# Patient Record
Sex: Female | Born: 1945
Health system: Southern US, Community
[De-identification: ages and names within clinical notes are randomized; demographics above are authoritative.]

## PROBLEM LIST (undated history)

## (undated) DIAGNOSIS — F419 Anxiety disorder, unspecified: Secondary | ICD-10-CM

## (undated) DIAGNOSIS — Z87442 Personal history of urinary calculi: Secondary | ICD-10-CM

## (undated) DIAGNOSIS — C801 Malignant (primary) neoplasm, unspecified: Secondary | ICD-10-CM

## (undated) HISTORY — PX: BREAST BIOPSY: SHX20

---

## 1988-03-08 DIAGNOSIS — C4491 Basal cell carcinoma of skin, unspecified: Secondary | ICD-10-CM

## 1988-03-08 HISTORY — DX: Basal cell carcinoma of skin, unspecified: C44.91

## 1998-05-31 ENCOUNTER — Other Ambulatory Visit: Admission: RE | Admit: 1998-05-31 | Discharge: 1998-05-31 | Payer: Self-pay | Admitting: Obstetrics and Gynecology

## 1998-07-19 ENCOUNTER — Other Ambulatory Visit: Admission: RE | Admit: 1998-07-19 | Discharge: 1998-07-19 | Payer: Self-pay | Admitting: Obstetrics and Gynecology

## 1999-04-27 DIAGNOSIS — C801 Malignant (primary) neoplasm, unspecified: Secondary | ICD-10-CM

## 1999-04-27 HISTORY — DX: Malignant (primary) neoplasm, unspecified: C80.1

## 1999-05-28 HISTORY — PX: ABDOMINAL HYSTERECTOMY: SHX81

## 2002-03-29 ENCOUNTER — Encounter: Payer: Self-pay | Admitting: Obstetrics and Gynecology

## 2002-03-29 ENCOUNTER — Ambulatory Visit (HOSPITAL_COMMUNITY): Admission: RE | Admit: 2002-03-29 | Discharge: 2002-03-29 | Payer: Self-pay | Admitting: Obstetrics and Gynecology

## 2003-06-15 ENCOUNTER — Ambulatory Visit (HOSPITAL_COMMUNITY): Admission: RE | Admit: 2003-06-15 | Discharge: 2003-06-15 | Payer: Self-pay | Admitting: Internal Medicine

## 2005-01-11 ENCOUNTER — Ambulatory Visit (HOSPITAL_COMMUNITY): Admission: RE | Admit: 2005-01-11 | Discharge: 2005-01-11 | Payer: Self-pay | Admitting: Internal Medicine

## 2005-05-27 HISTORY — PX: CATARACT EXTRACTION W/ INTRAOCULAR LENS  IMPLANT, BILATERAL: SHX1307

## 2005-07-23 ENCOUNTER — Ambulatory Visit (HOSPITAL_COMMUNITY): Admission: RE | Admit: 2005-07-23 | Discharge: 2005-07-23 | Payer: Self-pay | Admitting: Ophthalmology

## 2005-08-06 ENCOUNTER — Ambulatory Visit (HOSPITAL_COMMUNITY): Admission: RE | Admit: 2005-08-06 | Discharge: 2005-08-06 | Payer: Self-pay | Admitting: Ophthalmology

## 2006-03-12 DIAGNOSIS — D099 Carcinoma in situ, unspecified: Secondary | ICD-10-CM

## 2006-03-12 HISTORY — DX: Carcinoma in situ, unspecified: D09.9

## 2006-09-13 IMAGING — CR DG HIP W/ PELVIS BILAT
5 series · 5 of 5 positions shown · non-contrast
Comparison: None.

CLINICAL DATA: Bilateral hip pain for six months.  No known injury. 
 BILATERAL HIP ? 2 VIEW AND AP PELVIS ? 1 VIEW:

[view not recorded (1 of 5)]
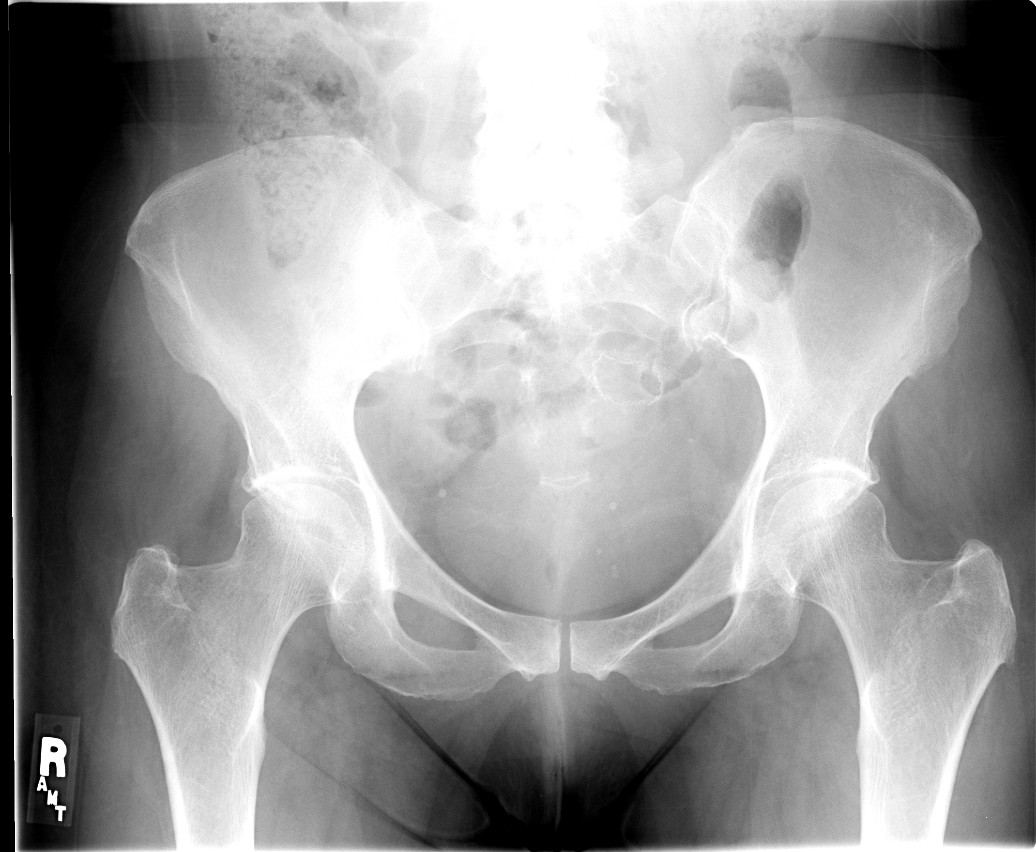

[view not recorded (2 of 5)]
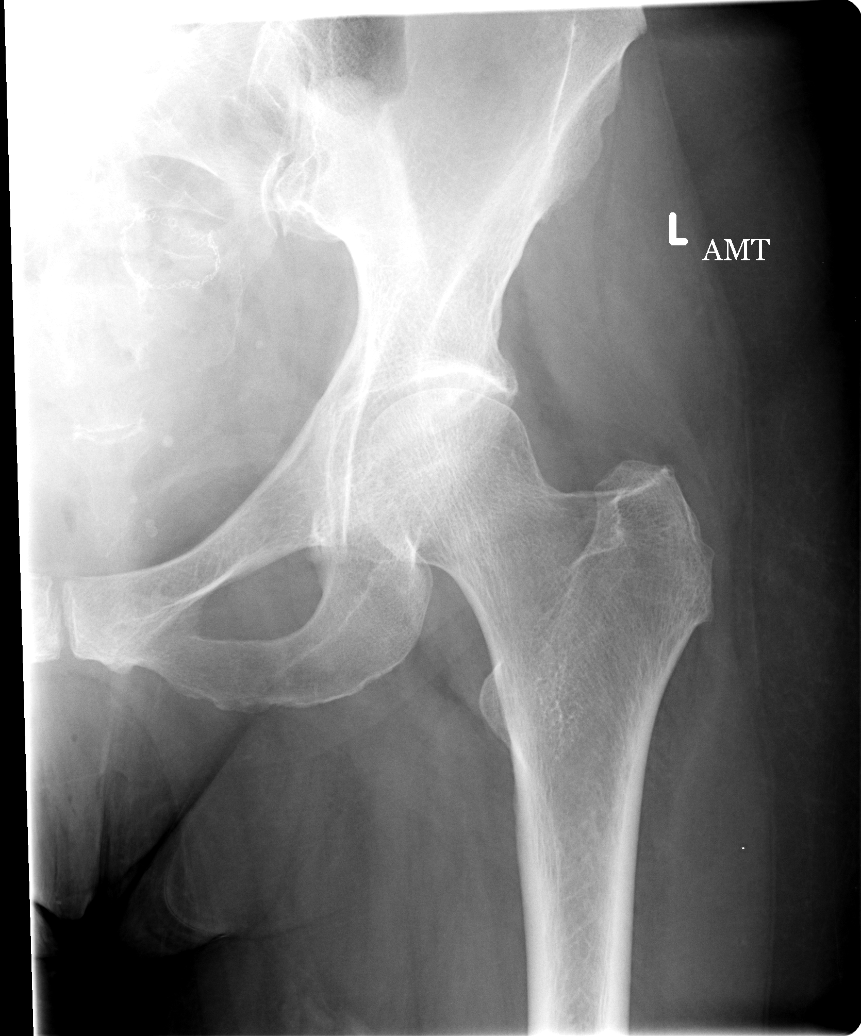

[view not recorded (3 of 5)]
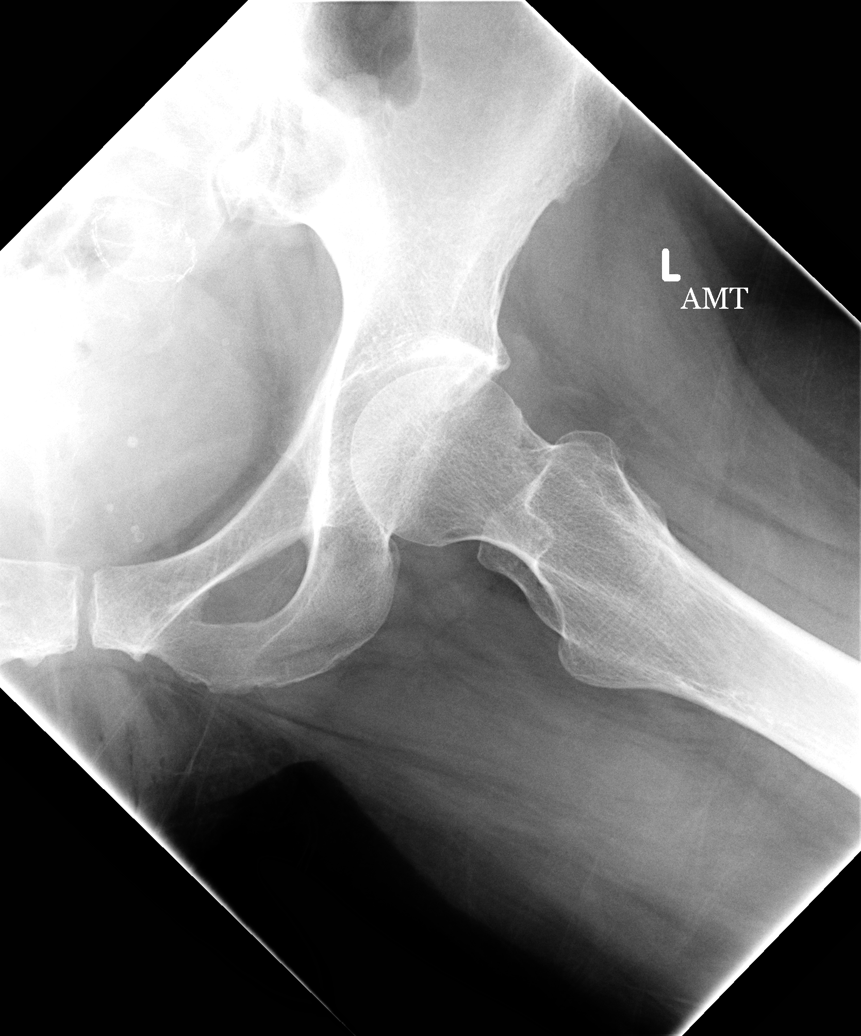

[view not recorded (4 of 5)]
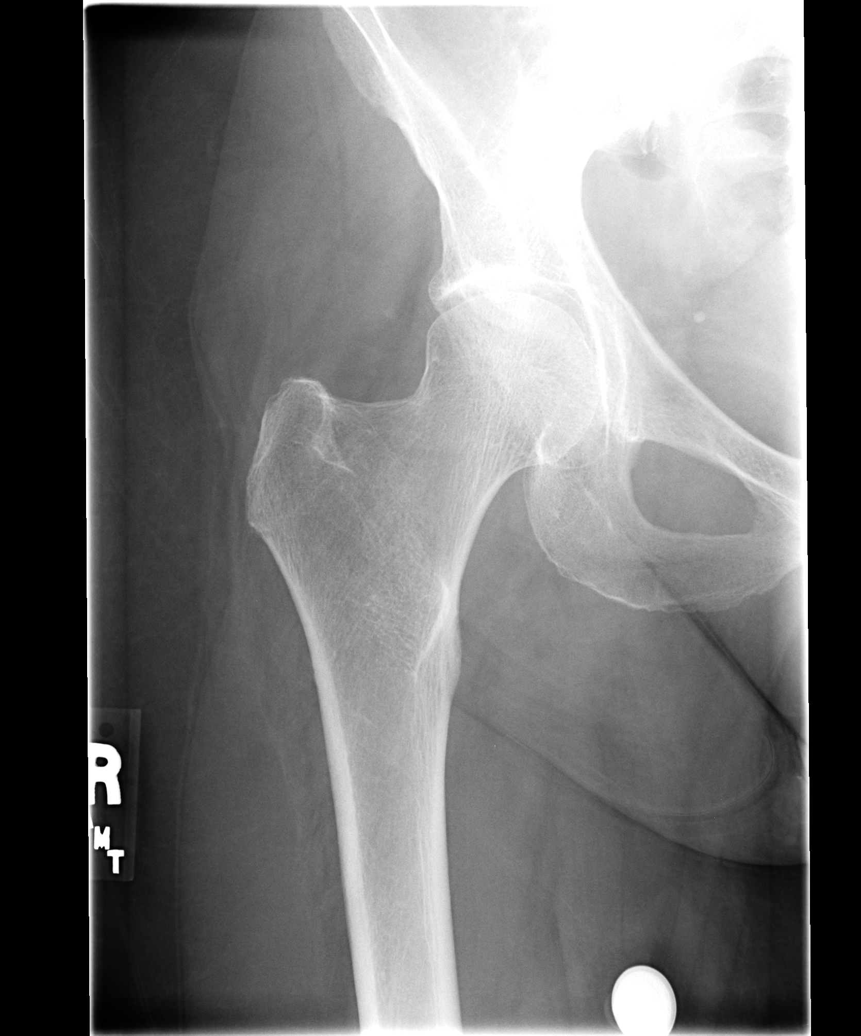

[view not recorded (5 of 5)]
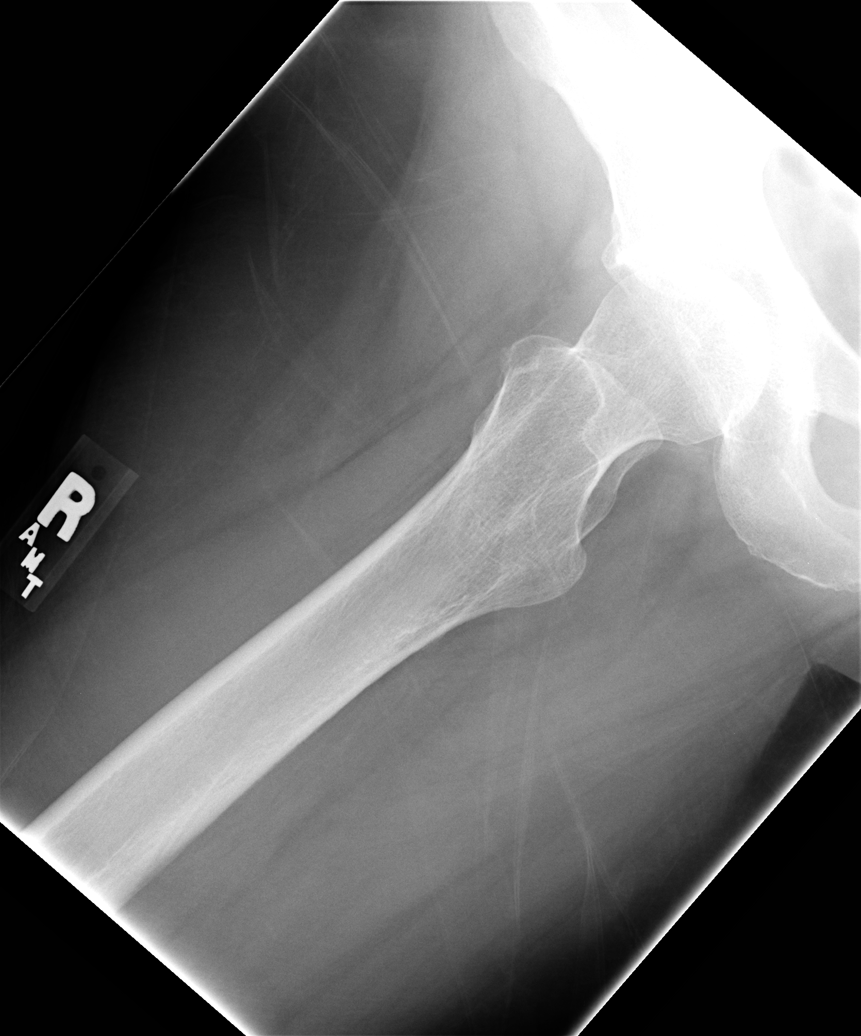

[5 of 5 positions shown; findings below may reference images not displayed]

AP pelvis and AP and frogleg lateral views of both hips demonstrate preserved hip joint spaces bilaterally.  There is no evidence of acute fracture or dislocation.  The femoral heads appear normal without evidence of osteonecrosis.  Sacroiliac joints appear unremarkable.  Bowel anastomosis clips overlie the left aspect of the sacrum, and there are bilateral pelvic calcifications typical of phleboliths.
IMPRESSION: Negative pelvic and hip radiographs.

## 2009-05-27 HISTORY — PX: COLON SURGERY: SHX602

## 2010-01-11 ENCOUNTER — Ambulatory Visit (HOSPITAL_BASED_OUTPATIENT_CLINIC_OR_DEPARTMENT_OTHER): Admission: RE | Admit: 2010-01-11 | Discharge: 2010-01-11 | Payer: Self-pay | Admitting: Urology

## 2010-08-10 LAB — POCT HEMOGLOBIN-HEMACUE: Hemoglobin: 14.6 g/dL (ref 12.0–15.0)

## 2010-10-12 NOTE — Op Note (Signed)
NAME:  Emily Boyd, Emily Boyd                         ACCOUNT NO.:  [id number]   MEDICAL RECORD NO.:  [id number]                   PATIENT TYPE:  AMB   LOCATION:  DAY                                  FACILITY:  [facility]   PHYSICIAN:  Lionel December, M.D.                 DATE OF BIRTH:  06/20/1945   DATE OF PROCEDURE:  06/15/2003  DATE OF DISCHARGE:                                 OPERATIVE REPORT   PROCEDURE:  Total colonoscopy.   INDICATIONS FOR PROCEDURE:  Dennie Bible is a 65 year old Caucasian female who is  undergoing surveillance colonoscopy.  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  She was offered palliative chemotherapy which  she declined.  She remains in remission.  She did have her last exam three  years ago.  The procedure and risks were reviewed with the patient, and  informed consent was obtained.   PREOPERATIVE MEDICATIONS:  Demerol 25 mg IV, Versed 4 mg IV in divided dose.   FINDINGS:  The procedure was performed in the endoscopy suite.  The  patient's vital signs and O2 saturations were monitored during the procedure  and remained stable.  The patient was placed in the left lateral recumbent  position and rectal examination performed.  No abnormality noted on external  or digital exam.  The Olympus videoscope was placed into the rectum and  advanced into the region of the proximal sigmoid colon and beyond.  The  anastomosis was wide open and located at 18 cm from the anal margin.  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  The  scope was passed to the cecum which was identified by the appendiceal  orifice/stump and ileocecal valve.  Pictures were taken for the record.  As  the scope was withdrawn, the colonic mucosa was carefully examined and was  normal throughout.  The rectal mucosa similarly was normal.  The scope was  retroflexed to examine the anorectal junction, and small hemorrhoids were  noted below the dentate line.   The endoscope was straightened and withdrawn.  The patient tolerated the procedure well.   FINAL DIAGNOSIS:  Small external hemorrhoids.  Otherwise normal colonoscopy.  Colorectal or colonic anastomosis located at 18 cm from the anal margin.   RECOMMENDATIONS:  1. She is to have lab studies and CA on a visit to [facility] later this month.  2. She should continue yearly Hemoccults and consider next surveillance exam     either in three or four years from now.      ___________________________________________                                            Lionel December, M.D.   NR/MEDQ  D:  06/15/2003  T:  06/15/2003  Job:  [id number]   cc:   Kingsley Callander. Ouida Sills, M.D.  [address]  [place]  [place] [address]  Fax: [phone]   Greer Ee, M.D.  Department of Surgery  [facility]

## 2011-05-01 ENCOUNTER — Encounter (INDEPENDENT_AMBULATORY_CARE_PROVIDER_SITE_OTHER): Payer: Self-pay | Admitting: *Deleted

## 2011-06-05 ENCOUNTER — Other Ambulatory Visit (INDEPENDENT_AMBULATORY_CARE_PROVIDER_SITE_OTHER): Payer: Self-pay | Admitting: *Deleted

## 2011-06-05 ENCOUNTER — Telehealth (INDEPENDENT_AMBULATORY_CARE_PROVIDER_SITE_OTHER): Payer: Self-pay | Admitting: *Deleted

## 2011-06-05 DIAGNOSIS — Z800 Family history of malignant neoplasm of digestive organs: Secondary | ICD-10-CM

## 2011-06-05 DIAGNOSIS — Z85038 Personal history of other malignant neoplasm of large intestine: Secondary | ICD-10-CM

## 2011-06-05 NOTE — Telephone Encounter (Signed)
Patient needs movi prep 

## 2011-06-06 MED ORDER — PEG-KCL-NACL-NASULF-NA ASC-C 100 G PO SOLR: 1.0000 | Freq: Once | ORAL | Status: DC

## 2011-06-20 ENCOUNTER — Telehealth (INDEPENDENT_AMBULATORY_CARE_PROVIDER_SITE_OTHER): Payer: Self-pay | Admitting: *Deleted

## 2011-06-20 NOTE — Telephone Encounter (Signed)
Requesting MD/PCP:  fagan     Name & DOB: Mikyla Lapinski February 08, 2046     Procedure: tcs  Reason/Indication:  Hx colon ca, + fh crc  Has patient had this procedure before?  yes  If so, when, by whom and where?  11/08 (in paper chart)  Is there a family history of colon cancer?  yes  Who?  What age when diagnosed?  brother  Is patient diabetic?   no      Does patient have prosthetic heart valve?  no  Do you have a pacemaker?  no  Has patient had joint replacement within last 12 months?  no  Is patient on Coumadin, Plavix and/or Aspirin? no  Medications: estradiol 0.5 mg daily, calcium 600 mg daily, fish oil 1000 mg daily, vit b12 500 mcg, miralax daily  Allergies: codeiene  Pharmacy: [facility]  Medication Adjustment: none  Procedure date & time: 07/17/11 @ 830

## 2011-06-21 NOTE — Telephone Encounter (Signed)
agree

## 2011-07-16 MED ORDER — SODIUM CHLORIDE 0.45 % IV SOLN
Freq: Once | INTRAVENOUS | Status: AC
  Administered 2011-07-17: 08:00:00 via INTRAVENOUS

## 2011-07-17 ENCOUNTER — Ambulatory Visit (HOSPITAL_COMMUNITY)
Admission: RE | Admit: 2011-07-17 | Discharge: 2011-07-17 | Disposition: A | Discharge status: Home Health | Payer: Medicare Other | Source: Ambulatory Visit | Attending: Internal Medicine | Admitting: Internal Medicine

## 2011-07-17 ENCOUNTER — Encounter (HOSPITAL_COMMUNITY): Payer: Self-pay | Admitting: *Deleted

## 2011-07-17 ENCOUNTER — Encounter (HOSPITAL_COMMUNITY)
Admission: RE | Disposition: A | Discharge status: Home Health | Payer: Self-pay | Source: Ambulatory Visit | Attending: Internal Medicine

## 2011-07-17 DIAGNOSIS — K5520 Angiodysplasia of colon without hemorrhage: Secondary | ICD-10-CM

## 2011-07-17 DIAGNOSIS — Z85038 Personal history of other malignant neoplasm of large intestine: Secondary | ICD-10-CM

## 2011-07-17 DIAGNOSIS — Z800 Family history of malignant neoplasm of digestive organs: Secondary | ICD-10-CM

## 2011-07-17 DIAGNOSIS — K644 Residual hemorrhoidal skin tags: Secondary | ICD-10-CM | POA: Insufficient documentation

## 2011-07-17 DIAGNOSIS — Z09 Encounter for follow-up examination after completed treatment for conditions other than malignant neoplasm: Secondary | ICD-10-CM

## 2011-07-17 DIAGNOSIS — Q2733 Arteriovenous malformation of digestive system vessel: Secondary | ICD-10-CM | POA: Insufficient documentation

## 2011-07-17 DIAGNOSIS — Z9049 Acquired absence of other specified parts of digestive tract: Secondary | ICD-10-CM | POA: Insufficient documentation

## 2011-07-17 DIAGNOSIS — K645 Perianal venous thrombosis: Secondary | ICD-10-CM

## 2011-07-17 HISTORY — PX: COLONOSCOPY: SHX5424

## 2011-07-17 HISTORY — DX: Malignant (primary) neoplasm, unspecified: C80.1

## 2011-07-17 SURGERY — COLONOSCOPY
Anesthesia: Moderate Sedation

## 2011-07-17 MED ORDER — MEPERIDINE HCL 50 MG/ML IJ SOLN
INTRAMUSCULAR | Status: DC | PRN
  Administered 2011-07-17 (×2): 25 mg via INTRAVENOUS

## 2011-07-17 MED ORDER — MIDAZOLAM HCL 5 MG/5ML IJ SOLN
INTRAMUSCULAR | Status: AC
  Filled 2011-07-17: qty 10

## 2011-07-17 MED ORDER — STERILE WATER FOR IRRIGATION IR SOLN
Status: DC | PRN
  Administered 2011-07-17: 09:00:00

## 2011-07-17 MED ORDER — MEPERIDINE HCL 50 MG/ML IJ SOLN
INTRAMUSCULAR | Status: AC
  Filled 2011-07-17: qty 1

## 2011-07-17 MED ORDER — MIDAZOLAM HCL 5 MG/5ML IJ SOLN
INTRAMUSCULAR | Status: DC | PRN
  Administered 2011-07-17: 1 mg via INTRAVENOUS
  Administered 2011-07-17 (×2): 2 mg via INTRAVENOUS

## 2011-07-17 NOTE — H&P (Signed)
Emily Boyd is an 66 y.o. female.   Chief Complaint: Patient is here for colonoscopy. HPI: Patient is 66 year old Caucasian female with history of colon carcinoma with sigmoid resection in January 2001. Her last surveillance exam was in November 2008 at [facility] in [place], [place]. She denies abdominal pain rectal bleeding or change in bowel habits. Family history is positive for colon carcinoma in a brother who had surgery at age 79 and is doing fine at age 87.  Past Medical History  Diagnosis Date  . Cancer dec 2000    colon    Past Surgical History  Procedure Date  . Colon surgery 2011  . Abdominal hysterectomy 2001, jan    fibroids  benign  . Cataract extraction w/ intraocular lens  implant, bilateral 2007  . Breast biopsy     25 years ago , right breast    Family History  Problem Relation Age of Onset  . Anesthesia problems Neg Hx   . Hypotension Neg Hx   . Malignant hyperthermia Neg Hx   . Pseudochol deficiency Neg Hx    Social History:  does not have a smoking history on file. She does not have any smokeless tobacco history on file. Her alcohol and drug histories not on file.  Allergies:  Allergies  Allergen Reactions  . Codeine Nausea And Vomiting    Medications Prior to Admission  Medication Dose Route Frequency Provider Last Rate Last Dose  . 0.45 % sodium chloride infusion   Intravenous Once Malissa Hippo, MD 20 mL/hr at 07/17/11 0815    . meperidine (DEMEROL) 50 MG/ML injection           . midazolam (VERSED) 5 MG/5ML injection            Medications Prior to Admission  Medication Sig Dispense Refill  . calcium-vitamin D 250-100 MG-UNIT per tablet Take 1 tablet by mouth 1 day or 1 dose.      . Cobalamine Combinations (B-12) 223 031 3673 MCG SUBL Place under the tongue.      . fish oil-omega-3 fatty acids 1000 MG capsule Take 1 g by mouth daily.      . polyethylene glycol (MIRALAX / GLYCOLAX) packet Take by mouth daily.      . peg 3350 powder (MOVIPREP) 100 G SOLR  Take 1 kit (100 g total) by mouth once.  1 kit  0    No results found for this or any previous visit (from the past 48 hour(s)). No results found.  Review of Systems  Constitutional: Negative for weight loss.  Gastrointestinal: Negative for abdominal pain, diarrhea, constipation, blood in stool and melena.    Blood pressure 135/85, pulse 99, temperature 97.1 F (36.2 C), temperature source Oral, resp. rate 21, height 5\' 6"  (1.676 m), weight 155 lb (70.308 kg), SpO2 99.00%. Physical Exam  Constitutional: She appears well-developed and well-nourished.  HENT:  Mouth/Throat: Oropharynx is clear and moist.  Eyes: Conjunctivae are normal. No scleral icterus.  Neck: No thyromegaly present.  Cardiovascular: Normal rate, regular rhythm and normal heart sounds.   No murmur heard. Respiratory: Effort normal and breath sounds normal.  GI: Soft. She exhibits no mass. There is no tenderness.  Musculoskeletal: She exhibits no edema.  Lymphadenopathy:    She has no cervical adenopathy.  Skin: Skin is warm.     Assessment/Plan First history of colon carcinoma. Surveillance colonoscopy.  Bernhard Koskinen U 07/17/2011, 8:35 AM

## 2011-07-17 NOTE — Discharge Instructions (Signed)
Resume usual medications and diet. No driving for 16-XWRUE. Next colonoscopy in 5 years.

## 2011-07-17 NOTE — Op Note (Signed)
COLONOSCOPY PROCEDURE REPORT  PATIENT:  Emily Boyd  MR#:  [id number] Birthdate:  04-09-1946, 66 y.o., female Endoscopist:  Dr. Malissa Hippo, MD Referred By:  Dr. Carylon Perches, MD Procedure Date: 07/17/2011  Procedure:   Colonoscopy  Indications:  The patient is a 66 year old Caucasian female who underwent sigmoid colectomy for adenocarcinoma colon and generally 2001. She has done well. She is returning for surveillance colonoscopy per her last exam was in November 2008. Family history is positive for colon carcinoma brother who have surgery at age 71 and doing fine 23 years later.  Informed Consent:  Procedure and risks were reviewed with the patient and informed consent was obtained.  Medications:  Demerol 50 mg IV Versed 5 mg IV  Description of procedure:  After a digital rectal exam was performed, that colonoscope was advanced from the anus through the rectum and colon to the area of the cecum, ileocecal valve and appendiceal orifice. The cecum was deeply intubated. These structures were well-seen and photographed for the record. From the level of the cecum and ileocecal valve, the scope was slowly and cautiously withdrawn. The mucosal surfaces were carefully surveyed utilizing scope tip to flexion to facilitate fold flattening as needed. The scope was pulled down into the rectum where a thorough exam including retroflexion was performed. Terminal ileum was also examined.  Findings:   Prep excellent. Normal terminal ileum. 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 No colonic polyps or other mucosal abnormalities noted. Anastamosis located at 17 cm from the anal margin. Small hemorrhoids below the dentate line  Therapeutic/Diagnostic Maneuvers Performed:  None  Complications:  None  Cecal Withdrawal Time:  11 minutes  Impression:  Normal terminal ileum. 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 Wide open colorectal anastomosis located at 17 cm from the anal margin. Small external hemorrhoids.  Recommendations:  Standard instructions given. May wait 5 years before her next exam.  Arjun Hard U  07/17/2011 9:02 AM  CC: Dr. Carylon Perches, MD, MD & Dr. Bonnetta Barry ref. provider found

## 2011-07-23 ENCOUNTER — Encounter (HOSPITAL_COMMUNITY): Payer: Self-pay | Admitting: Internal Medicine

## 2014-07-04 MED ORDER — TROPICAMIDE 1 % OP SOLN
OPHTHALMIC | Status: AC
  Filled 2014-07-04: qty 3

## 2014-07-04 NOTE — Discharge Instructions (Signed)
Emily Boyd  07/04/2014     Instructions    Activity: No Restrictions.   Diet: Resume Diet you were on at home.   Pain Medication: Tylenol if Needed.   CONTACT YOUR DOCTOR IF YOU HAVE PAIN, REDNESS IN YOUR EYE, OR DECREASED VISION.   Follow-up: today between 1:00-2:00 with Elta Guadeloupe T. Gershon Crane, MD.   Dr. Gershon Crane: [phone]      If you find that you cannot contact your physician, but feel that your signs and   Symptoms warrant a physician's attention, call the Emergency Room at   [phone] ext.[phone].

## 2014-07-05 ENCOUNTER — Ambulatory Visit (HOSPITAL_COMMUNITY)
Admission: RE | Admit: 2014-07-05 | Discharge: 2014-07-05 | Disposition: A | Discharge status: Home / Self Care | Payer: Medicare Other | Source: Ambulatory Visit | Attending: Ophthalmology | Admitting: Ophthalmology

## 2014-07-05 ENCOUNTER — Encounter (HOSPITAL_COMMUNITY): Payer: Self-pay | Admitting: *Deleted

## 2014-07-05 ENCOUNTER — Encounter (HOSPITAL_COMMUNITY)
Admission: RE | Disposition: A | Discharge status: Home / Self Care | Payer: Self-pay | Source: Ambulatory Visit | Attending: Ophthalmology

## 2014-07-05 DIAGNOSIS — H26491 Other secondary cataract, right eye: Secondary | ICD-10-CM | POA: Insufficient documentation

## 2014-07-05 HISTORY — PX: YAG LASER APPLICATION: SHX6189

## 2014-07-05 SURGERY — TREATMENT, USING YAG LASER
Anesthesia: LOCAL | Laterality: Right

## 2014-07-05 MED ORDER — TROPICAMIDE 1 % OP SOLN
1.0000 [drp] | OPHTHALMIC | Status: AC
  Administered 2014-07-05 (×3): 1 [drp] via OPHTHALMIC

## 2014-07-05 NOTE — H&P (Signed)
The patient was re examined and there is no change in the patients condition since the original H and P. 

## 2014-07-05 NOTE — Op Note (Signed)
Emily Bussa T. Gershon Crane, MD  Procedure: Yag Capsulotomy  Yag Laser Self Test Completedyes. Procedure: Posterior Capsulotomy, Eye Protection Worn by Staff yes. Laser In Use Sign on Door yes.  Laser: Nd:YAG Spot Size: Fixed Burst Mode: III Power Setting: 3.4 mJ/burst Number of shots: 16 Total energy delivered: 47.77 mJ   The patient tolerated the procedure without difficulty. No complications were encountered.   The patient was discharged home with the instructions to continue all her current glaucoma medications, if any.   Patient instructed to go to office at 0100 for intraocular pressure check.  Patient verbalizes understanding of discharge instructions Yes.  .    Pre-Operative Diagnosis: After-Cataract, obscuring vision, 366.53 OD Post-Operative Diagnosis: After-Cataract, obscuring vision, 366.53 OD

## 2014-07-07 ENCOUNTER — Encounter (HOSPITAL_COMMUNITY): Payer: Self-pay | Admitting: Ophthalmology

## 2014-08-02 ENCOUNTER — Ambulatory Visit (HOSPITAL_COMMUNITY)
Admission: RE | Admit: 2014-08-02 | Discharge: 2014-08-02 | Disposition: A | Discharge status: Home / Self Care | Payer: Medicare Other | Source: Ambulatory Visit | Attending: Ophthalmology | Admitting: Ophthalmology

## 2014-08-02 ENCOUNTER — Encounter (HOSPITAL_COMMUNITY): Payer: Self-pay | Admitting: *Deleted

## 2014-08-02 ENCOUNTER — Encounter (HOSPITAL_COMMUNITY)
Admission: RE | Disposition: A | Discharge status: Home / Self Care | Payer: Self-pay | Source: Ambulatory Visit | Attending: Ophthalmology

## 2014-08-02 DIAGNOSIS — H26492 Other secondary cataract, left eye: Secondary | ICD-10-CM | POA: Insufficient documentation

## 2014-08-02 DIAGNOSIS — Z79890 Hormone replacement therapy (postmenopausal): Secondary | ICD-10-CM | POA: Diagnosis not present

## 2014-08-02 DIAGNOSIS — H26499 Other secondary cataract, unspecified eye: Secondary | ICD-10-CM | POA: Diagnosis present

## 2014-08-02 HISTORY — PX: YAG LASER APPLICATION: SHX6189

## 2014-08-02 SURGERY — TREATMENT, USING YAG LASER
Anesthesia: LOCAL | Laterality: Left

## 2014-08-02 MED ORDER — TETRACAINE HCL 0.5 % OP SOLN
OPHTHALMIC | Status: AC
  Filled 2014-08-02: qty 2

## 2014-08-02 MED ORDER — TROPICAMIDE 1 % OP SOLN
1.0000 [drp] | OPHTHALMIC | Status: AC
  Administered 2014-08-02 (×2): 1 [drp] via OPHTHALMIC
  Filled 2014-08-02: qty 3

## 2014-08-02 NOTE — Discharge Instructions (Signed)
Emily Boyd  08/02/2014     Instructions    Activity: No Restrictions.   Diet: Resume Diet you were on at home.   Pain Medication: Tylenol if Needed.   CONTACT YOUR DOCTOR IF YOU HAVE PAIN, REDNESS IN YOUR EYE, OR DECREASED VISION.   Follow-up: today between 1:00-2:00  with Rutherford Guys, MD.   Dr. Gershon Crane: [phone]      If you find that you cannot contact your physician, but feel that your signs and   Symptoms warrant a physician's attention, call the Emergency Room at   [phone] [phone].

## 2014-08-02 NOTE — H&P (Signed)
The patient was re examined and there is no change in the patients condition since the original H and P. 

## 2014-08-02 NOTE — Op Note (Signed)
Francelia Mclaren T. Gershon Crane, MD  Procedure: Yag Capsulotomy  Yag Laser Self Test Completedyes. Procedure: Posterior Capsulotomy, Eye Protection Worn by Staff yes. Laser In Use Sign on Door yes.  Laser: Nd:YAG Spot Size: Fixed Burst Mode: III Power Setting: 3.2 mJ/burst Number of shots: 26 Total energy delivered: 76.37 mJ   The patient tolerated the procedure without difficulty. No complications were encountered.   The patient was discharged home with the instructions to continue all her current glaucoma medications, if any.   Patient instructed to go to office at 0100 for intraocular pressure check.  Patient verbalizes understanding of discharge instructions Yes.  .    Pre-Operative Diagnosis: After-Cataract, obscuring vision, 366.53 OS Post-Operative Diagnosis: After-Cataract, obscuring vision, 366.53 OS

## 2014-08-03 ENCOUNTER — Encounter (HOSPITAL_COMMUNITY): Payer: Self-pay | Admitting: Ophthalmology

## 2015-11-03 DIAGNOSIS — L905 Scar conditions and fibrosis of skin: Secondary | ICD-10-CM | POA: Diagnosis not present

## 2015-11-03 DIAGNOSIS — L570 Actinic keratosis: Secondary | ICD-10-CM | POA: Diagnosis not present

## 2015-11-03 DIAGNOSIS — D485 Neoplasm of uncertain behavior of skin: Secondary | ICD-10-CM | POA: Diagnosis not present

## 2015-11-03 DIAGNOSIS — L821 Other seborrheic keratosis: Secondary | ICD-10-CM | POA: Diagnosis not present

## 2016-02-02 DIAGNOSIS — C187 Malignant neoplasm of sigmoid colon: Secondary | ICD-10-CM | POA: Diagnosis not present

## 2016-02-02 DIAGNOSIS — Z85038 Personal history of other malignant neoplasm of large intestine: Secondary | ICD-10-CM | POA: Diagnosis not present

## 2016-02-29 DIAGNOSIS — D485 Neoplasm of uncertain behavior of skin: Secondary | ICD-10-CM | POA: Diagnosis not present

## 2016-02-29 DIAGNOSIS — C4492 Squamous cell carcinoma of skin, unspecified: Secondary | ICD-10-CM

## 2016-02-29 DIAGNOSIS — C44529 Squamous cell carcinoma of skin of other part of trunk: Secondary | ICD-10-CM | POA: Diagnosis not present

## 2016-02-29 DIAGNOSIS — L858 Other specified epidermal thickening: Secondary | ICD-10-CM | POA: Diagnosis not present

## 2016-02-29 DIAGNOSIS — L570 Actinic keratosis: Secondary | ICD-10-CM | POA: Diagnosis not present

## 2016-02-29 HISTORY — DX: Squamous cell carcinoma of skin, unspecified: C44.92

## 2016-03-15 DIAGNOSIS — C44529 Squamous cell carcinoma of skin of other part of trunk: Secondary | ICD-10-CM | POA: Diagnosis not present

## 2016-03-15 DIAGNOSIS — L570 Actinic keratosis: Secondary | ICD-10-CM | POA: Diagnosis not present

## 2016-04-24 DIAGNOSIS — Z01419 Encounter for gynecological examination (general) (routine) without abnormal findings: Secondary | ICD-10-CM | POA: Diagnosis not present

## 2016-04-24 DIAGNOSIS — Z6825 Body mass index (BMI) 25.0-25.9, adult: Secondary | ICD-10-CM | POA: Diagnosis not present

## 2016-04-24 DIAGNOSIS — Z1272 Encounter for screening for malignant neoplasm of vagina: Secondary | ICD-10-CM | POA: Diagnosis not present

## 2016-04-24 DIAGNOSIS — N959 Unspecified menopausal and perimenopausal disorder: Secondary | ICD-10-CM | POA: Diagnosis not present

## 2016-05-28 DIAGNOSIS — H5203 Hypermetropia, bilateral: Secondary | ICD-10-CM | POA: Diagnosis not present

## 2016-06-25 ENCOUNTER — Encounter (INDEPENDENT_AMBULATORY_CARE_PROVIDER_SITE_OTHER): Payer: Self-pay | Admitting: *Deleted

## 2016-07-08 DIAGNOSIS — L728 Other follicular cysts of the skin and subcutaneous tissue: Secondary | ICD-10-CM | POA: Diagnosis not present

## 2016-07-08 DIAGNOSIS — L570 Actinic keratosis: Secondary | ICD-10-CM | POA: Diagnosis not present

## 2016-07-08 DIAGNOSIS — L820 Inflamed seborrheic keratosis: Secondary | ICD-10-CM | POA: Diagnosis not present

## 2016-07-08 DIAGNOSIS — Z85828 Personal history of other malignant neoplasm of skin: Secondary | ICD-10-CM | POA: Diagnosis not present

## 2016-07-26 ENCOUNTER — Other Ambulatory Visit (INDEPENDENT_AMBULATORY_CARE_PROVIDER_SITE_OTHER): Payer: Self-pay | Admitting: *Deleted

## 2016-07-26 DIAGNOSIS — Z800 Family history of malignant neoplasm of digestive organs: Secondary | ICD-10-CM

## 2016-07-26 DIAGNOSIS — Z85038 Personal history of other malignant neoplasm of large intestine: Secondary | ICD-10-CM | POA: Insufficient documentation

## 2016-09-26 ENCOUNTER — Encounter (INDEPENDENT_AMBULATORY_CARE_PROVIDER_SITE_OTHER): Payer: Self-pay | Admitting: *Deleted

## 2016-09-26 NOTE — Telephone Encounter (Signed)
This encounter was created in error - please disregard.

## 2016-10-08 ENCOUNTER — Telehealth (INDEPENDENT_AMBULATORY_CARE_PROVIDER_SITE_OTHER): Payer: Self-pay | Admitting: *Deleted

## 2016-10-08 NOTE — Telephone Encounter (Signed)
Referring MD/PCP: galloway   Procedure: tcs  Reason/Indication:  Hx colon ca, fam hx colon ca  Has patient had this procedure before?  Yes, 2013  If so, when, by whom and where?    Is there a family history of colon cancer?  Yes, brother  Who?  What age when diagnosed?    Is patient diabetic?   no      Does patient have prosthetic heart valve or mechanical valve?  no  Do you have a pacemaker?  no  Has patient ever had endocarditis? no  Has patient had joint replacement within last 12 months?  no  Does patient tend to be constipated or take laxatives? yes  Does patient have a history of alcohol/drug use?  no  Is patient on Coumadin, Plavix and/or Aspirin? no  Medications: see epic  Allergies: codeine  Medication Adjustment per Dr Laural Golden:   Procedure date & time: 11/06/16 at 930

## 2016-10-09 NOTE — Telephone Encounter (Signed)
agree

## 2016-11-04 ENCOUNTER — Encounter (INDEPENDENT_AMBULATORY_CARE_PROVIDER_SITE_OTHER): Payer: Self-pay | Admitting: *Deleted

## 2016-11-04 NOTE — Telephone Encounter (Deleted)
Patient needs movi prep 

## 2016-11-04 NOTE — Telephone Encounter (Signed)
This encounter was created in error - please disregard.

## 2016-11-06 ENCOUNTER — Encounter (HOSPITAL_COMMUNITY)
Admission: RE | Disposition: A | Discharge status: Home / Self Care | Payer: Self-pay | Source: Ambulatory Visit | Attending: Internal Medicine

## 2016-11-06 ENCOUNTER — Encounter (HOSPITAL_COMMUNITY): Payer: Self-pay | Admitting: *Deleted

## 2016-11-06 ENCOUNTER — Ambulatory Visit (HOSPITAL_COMMUNITY)
Admission: RE | Admit: 2016-11-06 | Discharge: 2016-11-06 | Disposition: A | Discharge status: Home / Self Care | Payer: Medicare Other | Source: Ambulatory Visit | Attending: Internal Medicine | Admitting: Internal Medicine

## 2016-11-06 DIAGNOSIS — K644 Residual hemorrhoidal skin tags: Secondary | ICD-10-CM | POA: Insufficient documentation

## 2016-11-06 DIAGNOSIS — D123 Benign neoplasm of transverse colon: Secondary | ICD-10-CM | POA: Diagnosis not present

## 2016-11-06 DIAGNOSIS — Z85038 Personal history of other malignant neoplasm of large intestine: Secondary | ICD-10-CM | POA: Diagnosis not present

## 2016-11-06 DIAGNOSIS — Z980 Intestinal bypass and anastomosis status: Secondary | ICD-10-CM | POA: Diagnosis not present

## 2016-11-06 DIAGNOSIS — Z08 Encounter for follow-up examination after completed treatment for malignant neoplasm: Secondary | ICD-10-CM | POA: Diagnosis not present

## 2016-11-06 DIAGNOSIS — Z800 Family history of malignant neoplasm of digestive organs: Secondary | ICD-10-CM | POA: Diagnosis not present

## 2016-11-06 DIAGNOSIS — K5909 Other constipation: Secondary | ICD-10-CM | POA: Insufficient documentation

## 2016-11-06 DIAGNOSIS — Z1211 Encounter for screening for malignant neoplasm of colon: Secondary | ICD-10-CM | POA: Insufficient documentation

## 2016-11-06 DIAGNOSIS — Z85820 Personal history of malignant melanoma of skin: Secondary | ICD-10-CM | POA: Insufficient documentation

## 2016-11-06 DIAGNOSIS — K6289 Other specified diseases of anus and rectum: Secondary | ICD-10-CM | POA: Insufficient documentation

## 2016-11-06 DIAGNOSIS — Z79899 Other long term (current) drug therapy: Secondary | ICD-10-CM | POA: Diagnosis not present

## 2016-11-06 DIAGNOSIS — K5520 Angiodysplasia of colon without hemorrhage: Secondary | ICD-10-CM | POA: Diagnosis not present

## 2016-11-06 HISTORY — PX: COLONOSCOPY: SHX5424

## 2016-11-06 HISTORY — DX: Personal history of urinary calculi: Z87.442

## 2016-11-06 SURGERY — COLONOSCOPY
Anesthesia: Moderate Sedation

## 2016-11-06 MED ORDER — STERILE WATER FOR IRRIGATION IR SOLN
Status: DC | PRN
  Administered 2016-11-06: 4 mL

## 2016-11-06 MED ORDER — MIDAZOLAM HCL 5 MG/5ML IJ SOLN
INTRAMUSCULAR | Status: DC | PRN
  Administered 2016-11-06: 2 mg via INTRAVENOUS
  Administered 2016-11-06: 1 mg via INTRAVENOUS
  Administered 2016-11-06: 2 mg via INTRAVENOUS

## 2016-11-06 MED ORDER — MEPERIDINE HCL 50 MG/ML IJ SOLN
INTRAMUSCULAR | Status: DC | PRN
  Administered 2016-11-06 (×2): 25 mg via INTRAVENOUS

## 2016-11-06 MED ORDER — MEPERIDINE HCL 50 MG/ML IJ SOLN
INTRAMUSCULAR | Status: AC
  Filled 2016-11-06: qty 1

## 2016-11-06 MED ORDER — MIDAZOLAM HCL 5 MG/5ML IJ SOLN
INTRAMUSCULAR | Status: AC
  Filled 2016-11-06: qty 10

## 2016-11-06 MED ORDER — SODIUM CHLORIDE 0.9 % IV SOLN
INTRAVENOUS | Status: DC
  Administered 2016-11-06: 1000 mL via INTRAVENOUS

## 2016-11-06 NOTE — Op Note (Signed)
[facility] Patient Name: Emily Boyd Procedure Date: 11/06/2016 9:02 AM MRN: [id number] Date of Birth: [birth year] Attending MD: Hildred Laser , MD CSN: [id number] Age: 71 Admit Type: Outpatient Procedure:                Colonoscopy Indications:              High risk colon cancer surveillance: Personal                            history of colon cancer, Family history of colon                            cancer in a first-degree relative Providers:                Hildred Laser, MD, Janeece Riggers, RN, Gwynneth Albright RN, RN Referring MD:             Nat Math, MD Medicines:                Meperidine 50 mg IV, Midazolam 5 mg IV Complications:            No immediate complications. Estimated Blood Loss:     Estimated blood loss was minimal. Procedure:                Pre-Anesthesia Assessment:                           - Prior to the procedure, a History and Physical                            was performed, and patient medications and                            allergies were reviewed. The patient's tolerance of                            previous anesthesia was also reviewed. The risks                            and benefits of the procedure and the sedation                            options and risks were discussed with the patient.                            All questions were answered, and informed consent                            was obtained. Prior Anticoagulants: The patient has                            taken no previous anticoagulant or antiplatelet  agents. ASA Grade Assessment: I - A normal, healthy                            patient. After reviewing the risks and benefits,                            the patient was deemed in satisfactory condition to                            undergo the procedure.                           After obtaining informed consent, the colonoscope   was passed under direct vision. Throughout the                            procedure, the patient's blood pressure, pulse, and                            oxygen saturations were monitored continuously. The                            EC-[id number] ([id number]) scope was introduced through                            the anus and advanced to the the cecum, identified                            by appendiceal orifice and ileocecal valve. The                            colonoscopy was performed without difficulty. The                            patient tolerated the procedure well. The quality                            of the bowel preparation was good. The ileocecal                            valve, appendiceal orifice, and rectum were                            photographed. Scope In: 9:32:21 AM Scope Out: 9:51:04 AM Scope Withdrawal Time: 0 hours 14 minutes 12 seconds  Total Procedure Duration: 0 hours 18 minutes 43 seconds  Findings:      The perianal and digital rectal examinations were normal.      A small polyp was found in the transverse colon. The polyp was sessile.       Biopsies were taken with a cold forceps for histology.      There was evidence of a prior end-to-end colo-colonic anastomosis at 18       cm proximal to the anus. This was patent and was characterized by       healthy appearing mucosa.  External hemorrhoids were found during retroflexion. The hemorrhoids       were small.      Anal papilla(e) were hypertrophied.      A single small angiodysplastic lesion without bleeding was found in the       ascending colon. Impression:               - One small polyp in the transverse colon. Biopsied.                           - Patent end-to-end colo-colonic anastomosis,                            characterized by healthy appearing mucosa.                           - Single small AV malformation noted at ascending                            colon.                           -  External hemorrhoids.                           - Anal papilla(e) were hypertrophied. Moderate Sedation:      Moderate (conscious) sedation was administered by the endoscopy nurse       and supervised by the endoscopist. The following parameters were       monitored: oxygen saturation, heart rate, blood pressure, CO2       capnography and response to care. Total physician intraservice time was       25 minutes. Recommendation:           - Patient has a contact number available for                            emergencies. The signs and symptoms of potential                            delayed complications were discussed with the                            patient. Return to normal activities tomorrow.                            Written discharge instructions were provided to the                            patient.                           - Resume previous diet today.                           - Continue present medications.                           - No aspirin, ibuprofen,  naproxen, or other                            non-steroidal anti-inflammatory drugs for 1 day.                           - Await pathology results.                           - Repeat colonoscopy in 5 years for surveillance. Procedure Code(s):        --- Professional ---                           [phone], Colonoscopy, flexible; with biopsy, single                            or multiple                           99152, Moderate sedation services provided by the                            same physician or other qualified health care                            professional performing the diagnostic or                            therapeutic service that the sedation supports,                            requiring the presence of an independent trained                            observer to assist in the monitoring of the                            patient's level of consciousness and physiological                             status; initial 15 minutes of intraservice time,                            patient age 85 years or older                           [phone], Moderate sedation services; each additional                            15 minutes intraservice time Diagnosis Code(s):        --- Professional ---                           N27.782, Personal history of other malignant  neoplasm of large intestine                           D12.3, Benign neoplasm of transverse colon (hepatic                            flexure or splenic flexure)                           Z98.0, Intestinal bypass and anastomosis status                           K62.89, Other specified diseases of anus and rectum                           K64.4, Residual hemorrhoidal skin tags                           Z80.0, Family history of malignant neoplasm of                            digestive organs CPT copyright 2016 [employer]. All rights reserved. The codes documented in this report are preliminary and upon coder review may  be revised to meet current compliance requirements. Hildred Laser, MD Hildred Laser, MD 11/06/2016 10:00:03 AM This report has been signed electronically. Number of Addenda: 0

## 2016-11-06 NOTE — H&P (Signed)
Emily Boyd is an 71 y.o. female.   Chief Complaint: Patient is here for colonoscopy. HPI: Patient is 71 year old Caucasian female was history of colon carcinoma. She had sigmoid colon resection January 2001 and has remained in remission. Last colonoscopy was in February 2013. She has chronic constipation and takes MiraLAX. She denies abdominal pain or rectal bleeding. Family history significant for CRC and brother who was 4 at the time of diagnosis and doing fine over 25 years later. He has had melanoma removed twice.   Past Medical History:  Diagnosis Date  . Cancer ([place]) dec 2000   colon  . History of kidney stones     Past Surgical History:  Procedure Laterality Date  . ABDOMINAL HYSTERECTOMY  2001, jan   fibroids  benign  . BREAST BIOPSY     25 years ago , right breast  . CATARACT EXTRACTION W/ INTRAOCULAR LENS  IMPLANT, BILATERAL  2007  . COLON SURGERY  2011  . COLONOSCOPY  07/17/2011   Procedure: COLONOSCOPY;  Surgeon: Rogene Houston, MD;  Location: [facility];  Service: Endoscopy;  Laterality: N/A;  830  . YAG LASER APPLICATION Right 02/02/3569   Procedure: YAG LASER APPLICATION;  Surgeon: Elta Guadeloupe T. Gershon Crane, MD;  Location: [facility];  Service: Ophthalmology;  Laterality: Right;  right  . YAG LASER APPLICATION Left 06/02/7937   Procedure: YAG LASER APPLICATION;  Surgeon: Rutherford Guys, MD;  Location: [facility];  Service: Ophthalmology;  Laterality: Left;    Family History  Problem Relation Age of Onset  . Heart Problems Mother   . Cerebral aneurysm Father   . Melanoma Brother   . Colon cancer Brother   . Anesthesia problems Neg Hx   . Hypotension Neg Hx   . Malignant hyperthermia Neg Hx   . Pseudochol deficiency Neg Hx    Social History:  reports that she has never smoked. She has never used smokeless tobacco. She reports that she does not drink alcohol or use drugs.  Allergies:  Allergies  Allergen Reactions  . Codeine Nausea And Vomiting    Medications Prior to  Admission  Medication Sig Dispense Refill  . Ascorbic Acid (VITAMIN C) 1000 MG tablet Take 1,000 mg by mouth 3 (three) times a week.    . Calcium-Magnesium-Vitamin D (CALCIUM 1200+D3 PO) Take 1 tablet by mouth every other day.    . loratadine (CLARITIN) 10 MG tablet Take 10 mg by mouth daily.    . polyethylene glycol (MIRALAX / GLYCOLAX) packet Take 17 g by mouth daily.     . Pumpkin Seed-Soy Germ (AZO BLADDER CONTROL/GO-LESS) CAPS Take 1 capsule by mouth See admin instructions. 5 DAYS A WEEK    . vitamin B-12 (CYANOCOBALAMIN) 1000 MCG tablet Take 1,000 mcg by mouth every other day.    . ALPRAZolam (XANAX) 0.25 MG tablet Take 0.25 mg by mouth daily as needed for anxiety.      No results found for this or any previous visit (from the past 48 hour(s)). No results found.  ROS  Blood pressure 137/75, pulse (!) 116, temperature 97.9 F (36.6 C), temperature source Oral, resp. rate (!) 26, height 5\' 6"  (1.676 m), weight 155 lb (70.3 kg), SpO2 100 %. Physical Exam  Constitutional: She appears well-developed and well-nourished.  HENT:  Mouth/Throat: Oropharynx is clear and moist.  Eyes: Conjunctivae are normal. No scleral icterus.  Neck: No thyromegaly present.  Cardiovascular: Normal rate, regular rhythm and normal heart sounds.   No murmur heard. Respiratory: Effort  normal and breath sounds normal.  GI:  Abdomen is symmetrical with midline scar. It is soft and nontender without organomegaly or masses.  Musculoskeletal: She exhibits no edema.  Lymphadenopathy:    She has no cervical adenopathy.  Neurological: She is alert.  Skin: Skin is warm and dry.     Assessment/Plan History of CRC. Surveillance colonoscopy.  Hildred Laser, MD 11/06/2016, 9:22 AM

## 2016-11-06 NOTE — Discharge Instructions (Signed)
No aspirin or NSAIDs for 24 hours. Resume medications and diet as before. No driving for 24 hours. Physician will call with biopsy results. Next colonoscopy in 5 years.   Colonoscopy, Adult, Care After This sheet gives you information about how to care for yourself after your procedure. Your doctor may also give you more specific instructions. If you have problems or questions, call your doctor. Follow these instructions at home: General instructions   For the first 24 hours after the procedure: ? Do not drive or use machinery. ? Do not sign important documents. ? Do not drink alcohol. ? Do your daily activities more slowly than normal. ? Eat foods that are soft and easy to digest. ? Rest often.  Take over-the-counter or prescription medicines only as told by your doctor.  It is up to you to get the results of your procedure. Ask your doctor, or the department performing the procedure, when your results will be ready. To help cramping and bloating:  Try walking around.  Put heat on your belly (abdomen) as told by your doctor. Use a heat source that your doctor recommends, such as a moist heat pack or a heating pad. ? Put a towel between your skin and the heat source. ? Leave the heat on for 20-30 minutes. ? Remove the heat if your skin turns bright red. This is especially important if you cannot feel pain, heat, or cold. You can get burned. Eating and drinking  Drink enough fluid to keep your pee (urine) clear or pale yellow.  Return to your normal diet as told by your doctor. Avoid heavy or fried foods that are hard to digest.  Avoid drinking alcohol for as long as told by your doctor. Contact a doctor if:  You have blood in your poop (stool) 2-3 days after the procedure. Get help right away if:  You have more than a small amount of blood in your poop.  You see large clumps of tissue (blood clots) in your poop.  Your belly is swollen.  You feel sick to your stomach  (nauseous).  You throw up (vomit).  You have a fever.  You have belly pain that gets worse, and medicine does not help your pain. This information is not intended to replace advice given to you by your health care provider. Make sure you discuss any questions you have with your health care provider. Document Released: 06/15/2010 Document Revised: 02/05/2016 Document Reviewed: 02/05/2016 Elsevier Interactive Patient Education  2017 [employer]  Hemorrhoids Hemorrhoids are swollen veins in and around the rectum or anus. There are two types of hemorrhoids:  Internal hemorrhoids. These occur in the veins that are just inside the rectum. They may poke through to the outside and become irritated and painful.  External hemorrhoids. These occur in the veins that are outside of the anus and can be felt as a painful swelling or hard lump near the anus.  Most hemorrhoids do not cause serious problems, and they can be managed with home treatments such as diet and lifestyle changes. If home treatments do not help your symptoms, procedures can be done to shrink or remove the hemorrhoids. What are the causes? This condition is caused by increased pressure in the anal area. This pressure may result from various things, including:  Constipation.  Straining to have a bowel movement.  Diarrhea.  Pregnancy.  Obesity.  Sitting for long periods of time.  Heavy lifting or other activity that causes you to strain.  Anal  sex.  What are the signs or symptoms? Symptoms of this condition include:  Pain.  Anal itching or irritation.  Rectal bleeding.  Leakage of stool (feces).  Anal swelling.  One or more lumps around the anus.  How is this diagnosed? This condition can often be diagnosed through a visual exam. Other exams or tests may also be done, such as:  Examination of the rectal area with a gloved hand (digital rectal exam).  Examination of the anal canal using a small tube  (anoscope).  A blood test, if you have lost a significant amount of blood.  A test to look inside the colon (sigmoidoscopy or colonoscopy).  How is this treated? This condition can usually be treated at home. However, various procedures may be done if dietary changes, lifestyle changes, and other home treatments do not help your symptoms. These procedures can help make the hemorrhoids smaller or remove them completely. Some of these procedures involve surgery, and others do not. Common procedures include:  Rubber band ligation. Rubber bands are placed at the base of the hemorrhoids to cut off the blood supply to them.  Sclerotherapy. Medicine is injected into the hemorrhoids to shrink them.  Infrared coagulation. A type of light energy is used to get rid of the hemorrhoids.  Hemorrhoidectomy surgery. The hemorrhoids are surgically removed, and the veins that supply them are tied off.  Stapled hemorrhoidopexy surgery. A circular stapling device is used to remove the hemorrhoids and use staples to cut off the blood supply to them.  Follow these instructions at home: Eating and drinking  Eat foods that have a lot of fiber in them, such as whole grains, beans, nuts, fruits, and vegetables. Ask your health care provider about taking products that have added fiber (fiber supplements).  Drink enough fluid to keep your urine clear or pale yellow. Managing pain and swelling  Take warm sitz baths for 20 minutes, 3-4 times a day to ease pain and discomfort.  If directed, apply ice to the affected area. Using ice packs between sitz baths may be helpful. ? Put ice in a plastic bag. ? Place a towel between your skin and the bag. ? Leave the ice on for 20 minutes, 2-3 times a day. General instructions  Take over-the-counter and prescription medicines only as told by your health care provider.  Use medicated creams or suppositories as told.  Exercise regularly.  Go to the bathroom when you  have the urge to have a bowel movement. Do not wait.  Avoid straining to have bowel movements.  Keep the anal area dry and clean. Use wet toilet paper or moist towelettes after a bowel movement.  Do not sit on the toilet for long periods of time. This increases blood pooling and pain. Contact a health care provider if:  You have increasing pain and swelling that are not controlled by treatment or medicine.  You have uncontrolled bleeding.  You have difficulty having a bowel movement, or you are unable to have a bowel movement.  You have pain or inflammation outside the area of the hemorrhoids. This information is not intended to replace advice given to you by your health care provider. Make sure you discuss any questions you have with your health care provider. Document Released: 05/10/2000 Document Revised: 10/11/2015 Document Reviewed: 01/25/2015 Elsevier Interactive Patient Education  2017 [employer].  Colon Polyps Polyps are tissue growths inside the body. Polyps can grow in many places, including the large intestine (colon). A polyp may  be a round bump or a mushroom-shaped growth. You could have one polyp or several. Most colon polyps are noncancerous (benign). However, some colon polyps can become cancerous over time. What are the causes? The exact cause of colon polyps is not known. What increases the risk? This condition is more likely to develop in people who:  Have a family history of colon cancer or colon polyps.  Are older than 3 or older than 45 if they are African American.  Have inflammatory bowel disease, such as ulcerative colitis or Crohn disease.  Are overweight.  Smoke cigarettes.  Do not get enough exercise.  Drink too much alcohol.  Eat a diet that is: ? High in fat and red meat. ? Low in fiber.  Had childhood cancer that was treated with abdominal radiation.  What are the signs or symptoms? Most polyps do not cause symptoms. If you have  symptoms, they may include:  Blood coming from your rectum when having a bowel movement.  Blood in your stool.The stool may look dark red or black.  A change in bowel habits, such as constipation or diarrhea.  How is this diagnosed? This condition is diagnosed with a colonoscopy. This is a procedure that uses a lighted, flexible scope to look at the inside of your colon. How is this treated? Treatment for this condition involves removing any polyps that are found. Those polyps will then be tested for cancer. If cancer is found, your health care provider will talk to you about options for colon cancer treatment. Follow these instructions at home: Diet  Eat plenty of fiber, such as fruits, vegetables, and whole grains.  Eat foods that are high in calcium and vitamin D, such as milk, cheese, yogurt, eggs, liver, fish, and broccoli.  Limit foods high in fat, red meats, and processed meats, such as hot dogs, sausage, bacon, and lunch meats.  Maintain a healthy weight, or lose weight if recommended by your health care provider. General instructions  Do not smoke cigarettes.  Do not drink alcohol excessively.  Keep all follow-up visits as told by your health care provider. This is important. This includes keeping regularly scheduled colonoscopies. Talk to your health care provider about when you need a colonoscopy.  Exercise every day or as told by your health care provider. Contact a health care provider if:  You have new or worsening bleeding during a bowel movement.  You have new or increased blood in your stool.  You have a change in bowel habits.  You unexpectedly lose weight. This information is not intended to replace advice given to you by your health care provider. Make sure you discuss any questions you have with your health care provider. Document Released: 02/07/2004 Document Revised: 10/19/2015 Document Reviewed: 04/03/2015 Elsevier Interactive Patient Education   [employer].

## 2016-11-11 ENCOUNTER — Encounter (HOSPITAL_COMMUNITY): Payer: Self-pay | Admitting: Internal Medicine

## 2017-01-31 DIAGNOSIS — Z85038 Personal history of other malignant neoplasm of large intestine: Secondary | ICD-10-CM | POA: Diagnosis not present

## 2017-05-08 DIAGNOSIS — L570 Actinic keratosis: Secondary | ICD-10-CM | POA: Diagnosis not present

## 2017-05-08 DIAGNOSIS — D492 Neoplasm of unspecified behavior of bone, soft tissue, and skin: Secondary | ICD-10-CM | POA: Diagnosis not present

## 2017-05-08 DIAGNOSIS — L820 Inflamed seborrheic keratosis: Secondary | ICD-10-CM | POA: Diagnosis not present

## 2017-05-08 DIAGNOSIS — D2239 Melanocytic nevi of other parts of face: Secondary | ICD-10-CM | POA: Diagnosis not present

## 2017-07-29 DIAGNOSIS — M25579 Pain in unspecified ankle and joints of unspecified foot: Secondary | ICD-10-CM | POA: Diagnosis not present

## 2017-07-29 DIAGNOSIS — M10071 Idiopathic gout, right ankle and foot: Secondary | ICD-10-CM | POA: Diagnosis not present

## 2017-07-29 DIAGNOSIS — M79671 Pain in right foot: Secondary | ICD-10-CM | POA: Diagnosis not present

## 2017-08-04 DIAGNOSIS — M25579 Pain in unspecified ankle and joints of unspecified foot: Secondary | ICD-10-CM | POA: Diagnosis not present

## 2017-08-04 DIAGNOSIS — M79671 Pain in right foot: Secondary | ICD-10-CM | POA: Diagnosis not present

## 2017-11-06 DIAGNOSIS — D229 Melanocytic nevi, unspecified: Secondary | ICD-10-CM | POA: Diagnosis not present

## 2017-11-06 DIAGNOSIS — L570 Actinic keratosis: Secondary | ICD-10-CM | POA: Diagnosis not present

## 2017-11-06 DIAGNOSIS — S60851A Superficial foreign body of right wrist, initial encounter: Secondary | ICD-10-CM | POA: Diagnosis not present

## 2017-11-06 DIAGNOSIS — L820 Inflamed seborrheic keratosis: Secondary | ICD-10-CM | POA: Diagnosis not present

## 2017-11-11 DIAGNOSIS — M79671 Pain in right foot: Secondary | ICD-10-CM | POA: Diagnosis not present

## 2017-11-11 DIAGNOSIS — M10071 Idiopathic gout, right ankle and foot: Secondary | ICD-10-CM | POA: Diagnosis not present

## 2018-01-05 DIAGNOSIS — M79672 Pain in left foot: Secondary | ICD-10-CM | POA: Diagnosis not present

## 2018-01-05 DIAGNOSIS — M25579 Pain in unspecified ankle and joints of unspecified foot: Secondary | ICD-10-CM | POA: Diagnosis not present

## 2018-02-13 DIAGNOSIS — Z85038 Personal history of other malignant neoplasm of large intestine: Secondary | ICD-10-CM | POA: Diagnosis not present

## 2018-04-30 DIAGNOSIS — L570 Actinic keratosis: Secondary | ICD-10-CM | POA: Diagnosis not present

## 2018-04-30 DIAGNOSIS — C44729 Squamous cell carcinoma of skin of left lower limb, including hip: Secondary | ICD-10-CM | POA: Diagnosis not present

## 2018-04-30 DIAGNOSIS — D487 Neoplasm of uncertain behavior of other specified sites: Secondary | ICD-10-CM | POA: Diagnosis not present

## 2018-04-30 DIAGNOSIS — C44612 Basal cell carcinoma of skin of right upper limb, including shoulder: Secondary | ICD-10-CM | POA: Diagnosis not present

## 2018-04-30 DIAGNOSIS — C4491 Basal cell carcinoma of skin, unspecified: Secondary | ICD-10-CM

## 2018-04-30 HISTORY — DX: Basal cell carcinoma of skin, unspecified: C44.91

## 2018-06-11 DIAGNOSIS — C44729 Squamous cell carcinoma of skin of left lower limb, including hip: Secondary | ICD-10-CM | POA: Diagnosis not present

## 2018-06-11 DIAGNOSIS — C44612 Basal cell carcinoma of skin of right upper limb, including shoulder: Secondary | ICD-10-CM | POA: Diagnosis not present

## 2018-07-09 DIAGNOSIS — R928 Other abnormal and inconclusive findings on diagnostic imaging of breast: Secondary | ICD-10-CM | POA: Diagnosis not present

## 2018-07-09 DIAGNOSIS — Z1231 Encounter for screening mammogram for malignant neoplasm of breast: Secondary | ICD-10-CM | POA: Diagnosis not present

## 2018-07-09 DIAGNOSIS — M10071 Idiopathic gout, right ankle and foot: Secondary | ICD-10-CM | POA: Diagnosis not present

## 2018-07-21 DIAGNOSIS — Z6825 Body mass index (BMI) 25.0-25.9, adult: Secondary | ICD-10-CM | POA: Diagnosis not present

## 2018-07-21 DIAGNOSIS — Z0000 Encounter for general adult medical examination without abnormal findings: Secondary | ICD-10-CM | POA: Diagnosis not present

## 2018-07-22 DIAGNOSIS — R928 Other abnormal and inconclusive findings on diagnostic imaging of breast: Secondary | ICD-10-CM | POA: Diagnosis not present

## 2018-07-22 DIAGNOSIS — N6321 Unspecified lump in the left breast, upper outer quadrant: Secondary | ICD-10-CM | POA: Diagnosis not present

## 2018-07-23 DIAGNOSIS — M10071 Idiopathic gout, right ankle and foot: Secondary | ICD-10-CM | POA: Diagnosis not present

## 2018-07-23 DIAGNOSIS — M79671 Pain in right foot: Secondary | ICD-10-CM | POA: Diagnosis not present

## 2018-07-23 DIAGNOSIS — M79672 Pain in left foot: Secondary | ICD-10-CM | POA: Diagnosis not present

## 2018-07-28 DIAGNOSIS — C50412 Malignant neoplasm of upper-outer quadrant of left female breast: Secondary | ICD-10-CM | POA: Diagnosis not present

## 2018-07-28 DIAGNOSIS — N6321 Unspecified lump in the left breast, upper outer quadrant: Secondary | ICD-10-CM | POA: Diagnosis not present

## 2018-07-28 DIAGNOSIS — Z170 Estrogen receptor positive status [ER+]: Secondary | ICD-10-CM | POA: Diagnosis not present

## 2018-08-17 DIAGNOSIS — Z170 Estrogen receptor positive status [ER+]: Secondary | ICD-10-CM | POA: Diagnosis not present

## 2018-08-17 DIAGNOSIS — C50912 Malignant neoplasm of unspecified site of left female breast: Secondary | ICD-10-CM | POA: Diagnosis not present

## 2018-08-17 DIAGNOSIS — Z85038 Personal history of other malignant neoplasm of large intestine: Secondary | ICD-10-CM | POA: Diagnosis not present

## 2018-10-15 DIAGNOSIS — L820 Inflamed seborrheic keratosis: Secondary | ICD-10-CM | POA: Diagnosis not present

## 2018-10-15 DIAGNOSIS — D229 Melanocytic nevi, unspecified: Secondary | ICD-10-CM | POA: Diagnosis not present

## 2018-10-28 DIAGNOSIS — C50912 Malignant neoplasm of unspecified site of left female breast: Secondary | ICD-10-CM | POA: Diagnosis not present

## 2018-10-28 DIAGNOSIS — Z170 Estrogen receptor positive status [ER+]: Secondary | ICD-10-CM | POA: Diagnosis not present

## 2018-10-28 DIAGNOSIS — Z01812 Encounter for preprocedural laboratory examination: Secondary | ICD-10-CM | POA: Diagnosis not present

## 2018-10-28 DIAGNOSIS — Z1159 Encounter for screening for other viral diseases: Secondary | ICD-10-CM | POA: Diagnosis not present

## 2018-10-30 DIAGNOSIS — Z170 Estrogen receptor positive status [ER+]: Secondary | ICD-10-CM | POA: Diagnosis not present

## 2018-10-30 DIAGNOSIS — D0512 Intraductal carcinoma in situ of left breast: Secondary | ICD-10-CM | POA: Diagnosis not present

## 2018-10-30 DIAGNOSIS — C50912 Malignant neoplasm of unspecified site of left female breast: Secondary | ICD-10-CM | POA: Diagnosis not present

## 2018-10-30 DIAGNOSIS — C50412 Malignant neoplasm of upper-outer quadrant of left female breast: Secondary | ICD-10-CM | POA: Diagnosis not present

## 2018-10-30 DIAGNOSIS — Z79899 Other long term (current) drug therapy: Secondary | ICD-10-CM | POA: Diagnosis not present

## 2018-10-30 DIAGNOSIS — C50212 Malignant neoplasm of upper-inner quadrant of left female breast: Secondary | ICD-10-CM | POA: Diagnosis not present

## 2018-10-30 DIAGNOSIS — Z171 Estrogen receptor negative status [ER-]: Secondary | ICD-10-CM | POA: Diagnosis not present

## 2018-10-30 DIAGNOSIS — N6012 Diffuse cystic mastopathy of left breast: Secondary | ICD-10-CM | POA: Diagnosis not present

## 2018-10-30 DIAGNOSIS — N62 Hypertrophy of breast: Secondary | ICD-10-CM | POA: Diagnosis not present

## 2018-10-30 DIAGNOSIS — Z85038 Personal history of other malignant neoplasm of large intestine: Secondary | ICD-10-CM | POA: Diagnosis not present

## 2018-11-05 DIAGNOSIS — C50912 Malignant neoplasm of unspecified site of left female breast: Secondary | ICD-10-CM | POA: Diagnosis not present

## 2018-11-05 DIAGNOSIS — Z170 Estrogen receptor positive status [ER+]: Secondary | ICD-10-CM | POA: Diagnosis not present

## 2018-11-13 DIAGNOSIS — Z170 Estrogen receptor positive status [ER+]: Secondary | ICD-10-CM | POA: Diagnosis not present

## 2018-11-13 DIAGNOSIS — C50912 Malignant neoplasm of unspecified site of left female breast: Secondary | ICD-10-CM | POA: Diagnosis not present

## 2018-11-23 DIAGNOSIS — C50912 Malignant neoplasm of unspecified site of left female breast: Secondary | ICD-10-CM | POA: Diagnosis not present

## 2018-11-23 DIAGNOSIS — Z170 Estrogen receptor positive status [ER+]: Secondary | ICD-10-CM | POA: Diagnosis not present

## 2019-01-04 DIAGNOSIS — Z170 Estrogen receptor positive status [ER+]: Secondary | ICD-10-CM | POA: Diagnosis not present

## 2019-01-04 DIAGNOSIS — C50912 Malignant neoplasm of unspecified site of left female breast: Secondary | ICD-10-CM | POA: Diagnosis not present

## 2019-01-05 ENCOUNTER — Other Ambulatory Visit (HOSPITAL_COMMUNITY): Payer: Self-pay | Admitting: Nurse Practitioner

## 2019-01-05 DIAGNOSIS — C50912 Malignant neoplasm of unspecified site of left female breast: Secondary | ICD-10-CM

## 2019-01-05 DIAGNOSIS — Z170 Estrogen receptor positive status [ER+]: Secondary | ICD-10-CM

## 2019-01-25 ENCOUNTER — Ambulatory Visit (HOSPITAL_COMMUNITY)
Admission: RE | Admit: 2019-01-25 | Discharge: 2019-01-25 | Disposition: A | Discharge status: Home / Self Care | Payer: Medicare Other | Source: Ambulatory Visit | Attending: Nurse Practitioner | Admitting: Nurse Practitioner

## 2019-01-25 ENCOUNTER — Other Ambulatory Visit: Payer: Self-pay

## 2019-01-25 DIAGNOSIS — M810 Age-related osteoporosis without current pathological fracture: Secondary | ICD-10-CM | POA: Insufficient documentation

## 2019-01-25 DIAGNOSIS — Z780 Asymptomatic menopausal state: Secondary | ICD-10-CM | POA: Diagnosis not present

## 2019-01-25 DIAGNOSIS — Z170 Estrogen receptor positive status [ER+]: Secondary | ICD-10-CM | POA: Diagnosis not present

## 2019-01-25 DIAGNOSIS — C50912 Malignant neoplasm of unspecified site of left female breast: Secondary | ICD-10-CM | POA: Insufficient documentation

## 2019-02-08 DIAGNOSIS — F419 Anxiety disorder, unspecified: Secondary | ICD-10-CM | POA: Diagnosis not present

## 2019-02-08 DIAGNOSIS — R5383 Other fatigue: Secondary | ICD-10-CM | POA: Diagnosis not present

## 2019-02-08 DIAGNOSIS — Z79899 Other long term (current) drug therapy: Secondary | ICD-10-CM | POA: Diagnosis not present

## 2019-12-30 ENCOUNTER — Encounter: Payer: Self-pay | Admitting: Physician Assistant

## 2019-12-30 ENCOUNTER — Other Ambulatory Visit: Payer: Self-pay

## 2019-12-30 ENCOUNTER — Ambulatory Visit: Payer: Medicare Other | Admitting: Physician Assistant

## 2019-12-30 DIAGNOSIS — L729 Follicular cyst of the skin and subcutaneous tissue, unspecified: Secondary | ICD-10-CM

## 2019-12-30 DIAGNOSIS — L570 Actinic keratosis: Secondary | ICD-10-CM

## 2019-12-30 DIAGNOSIS — C44722 Squamous cell carcinoma of skin of right lower limb, including hip: Secondary | ICD-10-CM | POA: Diagnosis not present

## 2019-12-30 DIAGNOSIS — D485 Neoplasm of uncertain behavior of skin: Secondary | ICD-10-CM | POA: Diagnosis not present

## 2019-12-30 DIAGNOSIS — Z86007 Personal history of in-situ neoplasm of skin: Secondary | ICD-10-CM

## 2019-12-30 DIAGNOSIS — Z85828 Personal history of other malignant neoplasm of skin: Secondary | ICD-10-CM

## 2019-12-30 NOTE — Progress Notes (Signed)
   Follow up Visit  Subjective  Emily Boyd is a 74 y.o. female who presents for the following: Skin Problem (Check spot right lower leg. Popped up in june and it has now gotten better. Also check spot left bottom foot. Has a spot in right pubic area that has dark spot and gotte larger. ). Spot on right lower calf it has been there 2 months and looked bad but now looks a little better. It is no longer sore. Spot on the bottom of the left foot. Noticed 2 weeks ago and it hurts when touched. Rough spot left shoulder. Also spot in pubic area with black spot in the middle.   Objective  Well appearing patient in no apparent distress; mood and affect are within normal limits.  A focused examination was performed including face, chest, arms, back, abdomen, legs.. Relevant physical exam findings are noted in the Assessment and Plan. No suspicious moles noted on back.   Objective  Right Dorsal Hand, Right Wrist - Posterior: Erythematous patches with gritty scale.  Objective  Right Lower Leg - Posterior: Pink keratotic papule     Objective  Left Shoulder - Posterior: Pink scaling papule     Objective  Pubic: Small cystic nodule with open comedome in center.  Assessment & Plan  AK (actinic keratosis) (2) Right Wrist - Posterior; Right Dorsal Hand  Destruction of lesion - Right Dorsal Hand, Right Wrist - Posterior Complexity: simple   Destruction method: cryotherapy   Informed consent: discussed and consent obtained   Timeout:  patient name, date of birth, surgical site, and procedure verified Lesion destroyed using liquid nitrogen: Yes   Outcome: patient tolerated procedure well with no complications    Neoplasm of uncertain behavior of skin (2) Right Lower Leg - Posterior  Skin / nail biopsy Type of biopsy: tangential   Informed consent: discussed and consent obtained   Timeout: patient name, date of birth, surgical site, and procedure verified   Procedure prep:   Patient was prepped and draped in usual sterile fashion (Non sterile) Prep type:  Chlorhexidine Anesthesia: the lesion was anesthetized in a standard fashion   Anesthetic:  1% lidocaine w/ epinephrine 1-100,000 local infiltration Instrument used: flexible razor blade   Outcome: patient tolerated procedure well   Post-procedure details: wound care instructions given    Specimen 1 - Surgical pathology Differential Diagnosis: scc vs bcc Check Margins: No  Left Shoulder - Posterior  Skin / nail biopsy Type of biopsy: tangential   Informed consent: discussed and consent obtained   Timeout: patient name, date of birth, surgical site, and procedure verified   Procedure prep:  Patient was prepped and draped in usual sterile fashion (Non sterile) Prep type:  Chlorhexidine Anesthesia: the lesion was anesthetized in a standard fashion   Anesthetic:  1% lidocaine w/ epinephrine 1-100,000 local infiltration Instrument used: flexible razor blade   Outcome: patient tolerated procedure well   Post-procedure details: wound care instructions given    Specimen 2 - Surgical pathology Differential Diagnosis: scc vs bcc Check Margins: No  History of basal cell carcinoma (BCC) (2) Right Shoulder - Anterior; Right Breast  History of SCC (squamous cell carcinoma) of skin (2) Left Medial Thigh; Left Lower Back  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

## 2019-12-30 NOTE — Patient Instructions (Signed)

## 2020-01-05 ENCOUNTER — Telehealth: Payer: Self-pay

## 2020-01-05 NOTE — Telephone Encounter (Signed)
-----   Message from Arlyss Gandy, [place] sent at 01/04/2020  3:28 PM EDT ----- 30 min for lower leg

## 2020-01-05 NOTE — Telephone Encounter (Signed)
Pathology to ptient

## 2020-02-15 ENCOUNTER — Encounter: Payer: Self-pay | Admitting: Dermatology

## 2020-02-15 ENCOUNTER — Ambulatory Visit (INDEPENDENT_AMBULATORY_CARE_PROVIDER_SITE_OTHER): Payer: Medicare Other | Admitting: Dermatology

## 2020-02-15 ENCOUNTER — Other Ambulatory Visit: Payer: Self-pay

## 2020-02-15 DIAGNOSIS — C44722 Squamous cell carcinoma of skin of right lower limb, including hip: Secondary | ICD-10-CM

## 2020-02-15 DIAGNOSIS — C4492 Squamous cell carcinoma of skin, unspecified: Secondary | ICD-10-CM

## 2020-02-15 MED ORDER — MUPIROCIN 2 % EX OINT: 1.0000 "application " | TOPICAL_OINTMENT | Freq: Two times a day (BID) | CUTANEOUS | 0 refills | Status: AC

## 2020-02-15 NOTE — Progress Notes (Signed)
Right lower leg  cx3 32fu  1.5 cm treatment size

## 2020-02-15 NOTE — Patient Instructions (Signed)

## 2020-02-26 ENCOUNTER — Encounter: Payer: Self-pay | Admitting: Dermatology

## 2020-03-03 ENCOUNTER — Encounter: Payer: Self-pay | Admitting: Dermatology

## 2020-03-16 NOTE — Progress Notes (Signed)
   Follow-Up Visit   Subjective  Emily Boyd is a 74 y.o. female who presents for the following: Procedure.  Treatment of Squamous cell Location: Right lower leg posterior Duration:  Quality:  Associated Signs/Symptoms: Modifying Factors:  Severity:  Timing: Context:   Objective  Well appearing patient in no apparent distress; mood and affect are within normal limits.  A focused examination was performed including right lower leg.. Relevant physical exam findings are noted in the Assessment and Plan.   Assessment & Plan    Squamous cell carcinoma of skin Right Lower Leg - Posterior  Destruction of lesion Complexity: simple   Destruction method: electrodesiccation and curettage   Informed consent: discussed and consent obtained   Timeout:  patient name, date of birth, surgical site, and procedure verified Anesthesia: the lesion was anesthetized in a standard fashion   Anesthetic:  1% lidocaine w/ epinephrine 1-100,000 local infiltration Curettage performed in three different directions: Yes   Lesion length (cm):  1.5 Lesion width (cm):  1 Margin per side (cm):  0 Final wound size (cm):  1.5 Hemostasis achieved with:  ferric subsulfate Outcome: patient tolerated procedure well with no complications   Additional details:  Wound innoculated with 5 fluorouracil solution.  3 month follow up  Ordered Medications: mupirocin ointment (BACTROBAN) 2 %     I, Lavonna Monarch, MD, have reviewed all documentation for this visit.  The documentation on 03/19/20 for the exam, diagnosis, procedures, and orders are all accurate and complete.

## 2020-03-19 ENCOUNTER — Encounter: Payer: Self-pay | Admitting: Dermatology

## 2020-03-23 ENCOUNTER — Ambulatory Visit: Payer: Medicare Other | Admitting: Dermatology

## 2021-10-04 ENCOUNTER — Other Ambulatory Visit (HOSPITAL_COMMUNITY): Payer: Self-pay | Admitting: Family Medicine

## 2021-10-04 DIAGNOSIS — M810 Age-related osteoporosis without current pathological fracture: Secondary | ICD-10-CM

## 2021-10-16 ENCOUNTER — Encounter (INDEPENDENT_AMBULATORY_CARE_PROVIDER_SITE_OTHER): Payer: Self-pay | Admitting: *Deleted

## 2021-11-23 ENCOUNTER — Other Ambulatory Visit (HOSPITAL_COMMUNITY): Payer: Medicare Other

## 2021-11-28 ENCOUNTER — Ambulatory Visit (HOSPITAL_COMMUNITY)
Admission: RE | Admit: 2021-11-28 | Discharge: 2021-11-28 | Disposition: A | Discharge status: Home / Self Care | Payer: Medicare Other | Source: Ambulatory Visit | Attending: Family Medicine | Admitting: Family Medicine

## 2021-11-28 DIAGNOSIS — M810 Age-related osteoporosis without current pathological fracture: Secondary | ICD-10-CM | POA: Insufficient documentation

## 2023-07-15 ENCOUNTER — Encounter (INDEPENDENT_AMBULATORY_CARE_PROVIDER_SITE_OTHER): Payer: Self-pay | Admitting: *Deleted

## 2023-08-07 ENCOUNTER — Telehealth (INDEPENDENT_AMBULATORY_CARE_PROVIDER_SITE_OTHER): Payer: Self-pay | Admitting: Gastroenterology

## 2023-08-07 NOTE — Telephone Encounter (Signed)
 Who is your primary care physician: [facility]  Reasons for the colonoscopy:   Have you had a colonoscopy before?  Yes 2018  Do you have family history of colon cancer? Yes brother  Previous colonoscopy with polyps removed? Yes 2000  Do you have a history colorectal cancer?   yes  Are you diabetic? If yes, Type 1 or Type 2?    no  Do you have a prosthetic or mechanical heart valve? no  Do you have a pacemaker/defibrillator?   no  Have you had endocarditis/atrial fibrillation? no  Have you had joint replacement within the last 12 months?  no  Do you tend to be constipated or have to use laxatives? yes  Do you have any history of drugs or alchohol?  no  Do you use supplemental oxygen?  no  Have you had a stroke or heart attack within the last 6 months? no  Do you take weight loss medication?  no  For female patients: have you had a hysterectomy?  yes                                     are you post menopausal?       yes                                            do you still have your menstrual cycle? no      Do you take any blood-thinning medications such as: (aspirin, warfarin, Plavix, Aggrenox)  no  If yes we need the name, milligram, dosage and who is prescribing doctor  Current Outpatient Medications on File Prior to Visit  Medication Sig Dispense Refill   allopurinol (ZYLOPRIM) 100 MG tablet Take 100 mg by mouth daily.     ALPRAZolam (XANAX) 0.25 MG tablet Take 0.25 mg by mouth daily as needed for anxiety.     Ascorbic Acid (VITAMIN C) 1000 MG tablet Take 1,000 mg by mouth 3 (three) times a week.     Calcium-Magnesium-Vitamin D (CALCIUM 1200+D3 PO) Take 1 tablet by mouth every other day.     loratadine (CLARITIN) 10 MG tablet Take 10 mg by mouth daily.     mupirocin ointment (BACTROBAN) 2 % Apply 1 application topically 2 (two) times daily. 22 g 0   polyethylene glycol (MIRALAX / GLYCOLAX) packet Take 17 g by mouth daily.      Pumpkin  Seed-Soy Germ (AZO BLADDER CONTROL/GO-LESS) CAPS Take 1 capsule by mouth See admin instructions. 5 DAYS A WEEK     rosuvastatin (CRESTOR) 10 MG tablet Take 10 mg by mouth daily.     tamsulosin (FLOMAX) 0.4 MG CAPS capsule Take 0.4 mg by mouth at bedtime.     vitamin B-12 (CYANOCOBALAMIN) 1000 MCG tablet Take 1,000 mcg by mouth every other day.     No current facility-administered medications on file prior to visit.    Allergies  Allergen Reactions   Tape Other (See Comments)    blisters   Codeine Nausea And Vomiting     Pharmacy: [facility]  Primary Insurance Name: Valinda Hoar  Best number where you can be reached: [phone]

## 2023-08-07 NOTE — Telephone Encounter (Signed)
 Room 1 Thanks

## 2023-08-12 MED ORDER — CLENPIQ 10-3.5-12 MG-GM -GM/175ML PO SOLN: 1.0000 | ORAL | 0 refills | Status: DC

## 2023-08-12 NOTE — Addendum Note (Signed)
 Addended by: Marlowe Shores on: 08/12/2023 02:38 PM   Modules accepted: Orders

## 2023-08-12 NOTE — Telephone Encounter (Signed)
 Emily Boyd (rep for Clenpiq) dropped off samples of Clenpiq. Contacted pt and pt will come by office to pick up Clenpiq

## 2023-08-12 NOTE — Telephone Encounter (Signed)
 Pt contacted and scheduled. Instructions mailed to pt. Pt requested small volume prep;sent to pharmacy. No pa needed per insurance

## 2023-08-12 NOTE — Telephone Encounter (Signed)
 PA sent to insurance for Clenpiq.

## 2023-08-13 ENCOUNTER — Encounter (INDEPENDENT_AMBULATORY_CARE_PROVIDER_SITE_OTHER): Payer: Self-pay | Admitting: *Deleted

## 2023-08-13 NOTE — Telephone Encounter (Signed)
 Referral completed, TCS apt letter sent to PCP

## 2023-09-08 ENCOUNTER — Telehealth (INDEPENDENT_AMBULATORY_CARE_PROVIDER_SITE_OTHER): Payer: Self-pay | Admitting: Gastroenterology

## 2023-09-08 NOTE — Telephone Encounter (Signed)
Okay to proceed.  Thanks 

## 2023-09-08 NOTE — Telephone Encounter (Signed)
 Pt left voicemail stating that she ate a tomato at lunch today. Returned call to pt to get more information; pt ate a tomato sandwich, they were small tomatoes around 1pm. Pt ok to proceed with TCS on 09/10/23? Please advise. Thank you!

## 2023-09-08 NOTE — Telephone Encounter (Signed)
 Pt contacted and verbalized understanding.

## 2023-09-10 ENCOUNTER — Encounter (HOSPITAL_COMMUNITY): Payer: Self-pay | Admitting: Gastroenterology

## 2023-09-10 ENCOUNTER — Ambulatory Visit (HOSPITAL_COMMUNITY)
Admission: RE | Admit: 2023-09-10 | Discharge: 2023-09-10 | Disposition: A | Discharge status: Home / Self Care | Source: Ambulatory Visit | Attending: Gastroenterology | Admitting: Gastroenterology

## 2023-09-10 ENCOUNTER — Encounter (INDEPENDENT_AMBULATORY_CARE_PROVIDER_SITE_OTHER): Payer: Self-pay | Admitting: *Deleted

## 2023-09-10 ENCOUNTER — Ambulatory Visit (HOSPITAL_COMMUNITY)

## 2023-09-10 ENCOUNTER — Encounter (HOSPITAL_COMMUNITY)
Admission: RE | Disposition: A | Discharge status: Home / Self Care | Payer: Self-pay | Source: Ambulatory Visit | Attending: Gastroenterology

## 2023-09-10 ENCOUNTER — Other Ambulatory Visit: Payer: Self-pay

## 2023-09-10 ENCOUNTER — Ambulatory Visit (HOSPITAL_BASED_OUTPATIENT_CLINIC_OR_DEPARTMENT_OTHER)

## 2023-09-10 DIAGNOSIS — Z08 Encounter for follow-up examination after completed treatment for malignant neoplasm: Secondary | ICD-10-CM | POA: Diagnosis present

## 2023-09-10 DIAGNOSIS — K635 Polyp of colon: Secondary | ICD-10-CM | POA: Diagnosis not present

## 2023-09-10 DIAGNOSIS — Z980 Intestinal bypass and anastomosis status: Secondary | ICD-10-CM | POA: Diagnosis not present

## 2023-09-10 DIAGNOSIS — K648 Other hemorrhoids: Secondary | ICD-10-CM | POA: Diagnosis not present

## 2023-09-10 DIAGNOSIS — Z1211 Encounter for screening for malignant neoplasm of colon: Secondary | ICD-10-CM | POA: Diagnosis not present

## 2023-09-10 DIAGNOSIS — D122 Benign neoplasm of ascending colon: Secondary | ICD-10-CM | POA: Diagnosis not present

## 2023-09-10 DIAGNOSIS — Z85038 Personal history of other malignant neoplasm of large intestine: Secondary | ICD-10-CM | POA: Insufficient documentation

## 2023-09-10 HISTORY — PX: COLONOSCOPY: SHX5424

## 2023-09-10 HISTORY — DX: Anxiety disorder, unspecified: F41.9

## 2023-09-10 LAB — HM COLONOSCOPY

## 2023-09-10 SURGERY — COLONOSCOPY
Anesthesia: General

## 2023-09-10 MED ORDER — PROPOFOL 10 MG/ML IV BOLUS
INTRAVENOUS | Status: DC | PRN
  Administered 2023-09-10: 120 mg via INTRAVENOUS
  Administered 2023-09-10: 100 ug/kg/min via INTRAVENOUS

## 2023-09-10 MED ORDER — LACTATED RINGERS IV SOLN: INTRAVENOUS | Status: DC | PRN

## 2023-09-10 NOTE — Anesthesia Procedure Notes (Signed)
 Date/Time: 09/10/2023 8:12 AM  Performed by: Verline Glow, CRNAPre-anesthesia Checklist: Patient identified, Emergency Drugs available, Suction available, Timeout performed and Patient being monitored Patient Re-evaluated:Patient Re-evaluated prior to induction Oxygen Delivery Method: Nasal Cannula

## 2023-09-10 NOTE — Discharge Instructions (Signed)
 You are being discharged to home.  Resume your previous diet.  Your physician has recommended a repeat colonoscopy in five years for surveillance.  We are waiting for your pathology results.

## 2023-09-10 NOTE — Transfer of Care (Signed)
 Immediate Anesthesia Transfer of Care Note  Patient: Emily Boyd  Procedure(s) Performed: COLONOSCOPY  Patient Location: Endoscopy Unit  Anesthesia Type:General  Level of Consciousness: awake  Airway & Oxygen Therapy: Patient Spontanous Breathing  Post-op Assessment: Report given to RN and Post -op Vital signs reviewed and stable  Post vital signs: Reviewed and stable  Last Vitals:  Vitals Value Taken Time  BP 98/48 09/10/23 0831  Temp 36.4 C 09/10/23 0831  Pulse 60 09/10/23 0831  Resp 16 09/10/23 0831  SpO2 96 % 09/10/23 0831    Last Pain:  Vitals:   09/10/23 0831  TempSrc: Oral  PainSc: 0-No pain      Patients Stated Pain Goal: 7 (09/10/23 0722)  Complications: No notable events documented.

## 2023-09-10 NOTE — Anesthesia Preprocedure Evaluation (Signed)
 Anesthesia Evaluation  Patient identified by MRN, date of birth, ID band Patient awake    Airway Mallampati: II  TM Distance: >3 FB Neck ROM: Full    Dental no notable dental hx.    Pulmonary neg pulmonary ROS   breath sounds clear to auscultation       Cardiovascular Exercise Tolerance: Good negative cardio ROS Normal cardiovascular exam Rhythm:Regular Rate:Normal     Neuro/Psych   Anxiety     negative neurological ROS     GI/Hepatic negative GI ROS, Neg liver ROS,,,  Endo/Other  negative endocrine ROS    Renal/GU negative Renal ROS  negative genitourinary   Musculoskeletal negative musculoskeletal ROS (+)    Abdominal Normal abdominal exam  (+)   Peds  Hematology negative hematology ROS (+)   Anesthesia Other Findings   Reproductive/Obstetrics                             Anesthesia Physical Anesthesia Plan  ASA: 1  Anesthesia Plan: General   Post-op Pain Management:    Induction: Intravenous  PONV Risk Score and Plan: 0 and Propofol infusion  Airway Management Planned: Nasal Cannula  Additional Equipment:   Intra-op Plan:   Post-operative Plan:   Informed Consent: I have reviewed the patients History and Physical, chart, labs and discussed the procedure including the risks, benefits and alternatives for the proposed anesthesia with the patient or authorized representative who has indicated his/her understanding and acceptance.       Plan Discussed with: CRNA  Anesthesia Plan Comments:        Anesthesia Quick Evaluation

## 2023-09-10 NOTE — Anesthesia Postprocedure Evaluation (Signed)
 Anesthesia Post Note  Patient: Emily Boyd  Procedure(s) Performed: COLONOSCOPY  Patient location during evaluation: PACU Anesthesia Type: General Level of consciousness: awake and alert Pain management: pain level controlled Vital Signs Assessment: post-procedure vital signs reviewed and stable Respiratory status: spontaneous breathing, nonlabored ventilation, respiratory function stable and patient connected to nasal cannula oxygen Cardiovascular status: stable and blood pressure returned to baseline Postop Assessment: no apparent nausea or vomiting Anesthetic complications: no  No notable events documented.   Last Vitals:  Vitals:   09/10/23 0722 09/10/23 0831  BP: 139/71 (!) 98/48  Pulse: 71 60  Resp: 16 16  Temp: 36.7 C (!) 36.4 C  SpO2: 96% 96%    Last Pain:  Vitals:   09/10/23 0831  TempSrc: Oral  PainSc: 0-No pain                 Beacher Limerick

## 2023-09-10 NOTE — Op Note (Signed)
 [facility] Patient Name: Emily Boyd Procedure Date: 09/10/2023 8:00 AM MRN: [id number] Date of Birth: June 23, 1945 Attending MD: Samantha Cress , , [id number] CSN: [id number] Age: 78 Admit Type: Outpatient Procedure:                Colonoscopy Indications:              Follow-up of colorectal cancer Providers:                Samantha Cress, Karyle Pagoda, RN, [place] Wilson,                            Technician, Sharlette Dayhoff Technician, Technician Referring MD:              Medicines:                Monitored Anesthesia Care Complications:            No immediate complications. Estimated Blood Loss:     Estimated blood loss: none. Procedure:                Pre-Anesthesia Assessment:                           - Prior to the procedure, a History and Physical                            was performed, and patient medications, allergies                            and sensitivities were reviewed. The patient's                            tolerance of previous anesthesia was reviewed.                           - The risks and benefits of the procedure and the                            sedation options and risks were discussed with the                            patient. All questions were answered and informed                            consent was obtained.                           - ASA Grade Assessment: II - A patient with mild                            systemic disease.                           After obtaining informed consent, the colonoscope                            was passed under direct vision. Throughout the  procedure, the patient's blood pressure, pulse, and                            oxygen saturations were monitored continuously. The                            [id number]-HQ190L ([id number]) scope was introduced through                            the anus and advanced to the the terminal ileum.                            The colonoscopy was performed  without difficulty.                            The patient tolerated the procedure well. The                            quality of the bowel preparation was excellent. Scope In: 8:16:59 AM Scope Out: 8:28:27 AM Scope Withdrawal Time: 0 hours 8 minutes 46 seconds  Total Procedure Duration: 0 hours 11 minutes 28 seconds  Findings:      The perianal and digital rectal examinations were normal.      The terminal ileum appeared normal.      A 3 mm polyp was found in the ascending colon. The polyp was sessile.       The polyp was removed with a cold snare. Resection and retrieval were       complete.      There was evidence of a prior end-to-end colo-colonic anastomosis in the       sigmoid colon. This was patent and was characterized by healthy       appearing mucosa. The anastomosis was traversed.      Non-bleeding internal hemorrhoids were found during retroflexion. The       hemorrhoids were small. Impression:               - The examined portion of the ileum was normal.                           - One 3 mm polyp in the ascending colon, removed                            with a cold snare. Resected and retrieved.                           - Patent end-to-end colo-colonic anastomosis,                            characterized by healthy appearing mucosa.                           - Non-bleeding internal hemorrhoids. Moderate Sedation:      Per Anesthesia Care Recommendation:           - Discharge patient to home (ambulatory).                           -  Resume previous diet.                           - Repeat colonoscopy in 5 years for surveillance.                           - Await pathology results. Procedure Code(s):        --- Professional ---                           [phone], Colonoscopy, flexible; with removal of                            tumor(s), polyp(s), or other lesion(s) by snare                            technique Diagnosis Code(s):        --- Professional ---                            K64.8, Other hemorrhoids                           D12.2, Benign neoplasm of ascending colon                           Z98.0, Intestinal bypass and anastomosis status                           C19, Malignant neoplasm of rectosigmoid junction CPT copyright 2022 [employer]. All rights reserved. The codes documented in this report are preliminary and upon coder review may  be revised to meet current compliance requirements. Samantha Cress, MD Samantha Cress,  09/10/2023 8:34:16 AM This report has been signed electronically. Number of Addenda: 0

## 2023-09-10 NOTE — H&P (Signed)
 Emily Boyd is an 78 y.o. female.   Chief Complaint: history colon cancer HPI: 78 y/o F with PMH colon cancer s/p resection, SCC, anxiety, who comes for history of colon cancer.  Last colonoscopy in 2018, had one tubular adenoma removed. The patient denies having any complaints such as melena, hematochezia, abdominal pain or distention, change in her bowel movement consistency or frequency, no changes in weight recently.  Brother had history of colon cancer.   Past Medical History:  Diagnosis Date   Anxiety    Cancer (HCC) 04/1999   colon   History of kidney stones    SCC (squamous cell carcinoma) Atypical Proliferation 04/30/2018   Left Inner Thigh (Cx3,5FU)   SCC (squamous cell carcinoma) Keratoacanthoma 02/29/2016   Left Mid Back (Cx3,5FU)   Squamous cell carcinoma in situ (SCCIS) 03/12/2006   Right Inner Cheek (Cx3,5FU)   Superficial basal cell carcinoma (BCC) 03/08/1988   Right Chest   Superficial nodular basal cell carcinoma (BCC) 04/30/2018   Right Shoulder (Cx3,5FU)    Past Surgical History:  Procedure Laterality Date   ABDOMINAL HYSTERECTOMY  05/28/1999   fibroids  benign   BREAST BIOPSY     25 years ago , right breast   CATARACT EXTRACTION W/ INTRAOCULAR LENS  IMPLANT, BILATERAL  05/27/2005   COLON SURGERY  05/27/2009   COLONOSCOPY  07/17/2011   Procedure: COLONOSCOPY;  Surgeon: Ruby Corporal, MD;  Location: [facility];  Service: Endoscopy;  Laterality: N/A;  830   COLONOSCOPY N/A 11/06/2016   Procedure: COLONOSCOPY;  Surgeon: Ruby Corporal, MD;  Location: [facility];  Service: Endoscopy;  Laterality: N/A;  930   YAG LASER APPLICATION Right 07/05/2014   Procedure: YAG LASER APPLICATION;  Surgeon: Lavonia Powers T. Gennie Kicks, MD;  Location: [facility];  Service: Ophthalmology;  Laterality: Right;  right   YAG LASER APPLICATION Left 08/02/2014   Procedure: YAG LASER APPLICATION;  Surgeon: Albert Huff, MD;  Location: [facility];  Service: Ophthalmology;  Laterality: Left;     Family History  Problem Relation Age of Onset   Heart Problems Mother    Melanoma Mother    Cerebral aneurysm Father    Melanoma Brother    Colon cancer Brother    Anesthesia problems Neg Hx    Hypotension Neg Hx    Malignant hyperthermia Neg Hx    Pseudochol deficiency Neg Hx    Social History:  reports that she has never smoked. She has never used smokeless tobacco. She reports that she does not drink alcohol and does not use drugs.  Allergies:  Allergies  Allergen Reactions   Tape Other (See Comments)    blisters   Codeine Nausea And Vomiting    Medications Prior to Admission  Medication Sig Dispense Refill   ALPRAZolam (XANAX) 0.25 MG tablet Take 0.25 mg by mouth daily as needed for anxiety.     Sod Picosulfate-Mag Ox-Cit Acd (CLENPIQ) 10-3.5-12 MG-GM -GM/175ML SOLN Take 1 kit by mouth as directed. 350 mL 0   allopurinol (ZYLOPRIM) 100 MG tablet Take 100 mg by mouth daily.     Ascorbic Acid (VITAMIN C) 1000 MG tablet Take 1,000 mg by mouth 3 (three) times a week.     Calcium-Magnesium-Vitamin D (CALCIUM 1200+D3 PO) Take 1 tablet by mouth every other day.     loratadine (CLARITIN) 10 MG tablet Take 10 mg by mouth daily.     mupirocin ointment (BACTROBAN) 2 % Apply 1 application topically 2 (two) times daily. 22 g  0   polyethylene glycol (MIRALAX / GLYCOLAX) packet Take 17 g by mouth daily.      Pumpkin Seed-Soy Germ (AZO BLADDER CONTROL/GO-LESS) CAPS Take 1 capsule by mouth See admin instructions. 5 DAYS A WEEK     rosuvastatin (CRESTOR) 10 MG tablet Take 10 mg by mouth daily.     tamsulosin (FLOMAX) 0.4 MG CAPS capsule Take 0.4 mg by mouth at bedtime.     vitamin B-12 (CYANOCOBALAMIN) 1000 MCG tablet Take 1,000 mcg by mouth every other day.      No results found for this or any previous visit (from the past 48 hours). No results found.  Review of Systems  All other systems reviewed and are negative.   Blood pressure 139/71, pulse 71, temperature 98.1 F (36.7  C), temperature source Oral, resp. rate 16, height 5\' 6"  (1.676 m), weight 72.6 kg, SpO2 96%. Physical Exam  GENERAL: The patient is AO x3, in no acute distress. HEENT: Head is normocephalic and atraumatic. EOMI are intact. Mouth is well hydrated and without lesions. NECK: Supple. No masses LUNGS: Clear to auscultation. No presence of rhonchi/wheezing/rales. Adequate chest expansion HEART: RRR, normal s1 and s2. ABDOMEN: Soft, nontender, no guarding, no peritoneal signs, and nondistended. BS +. No masses. EXTREMITIES: Without any cyanosis, clubbing, rash, lesions or edema. NEUROLOGIC: AOx3, no focal motor deficit. SKIN: no jaundice, no rashes  Assessment/Plan  79 y/o F with PMH colon cancer s/p resection, SCC, anxiety, who comes for history of colon cancer. We will proceed with colonoscopy.  Urban Garden, MD 09/10/2023, 7:36 AM

## 2023-09-11 ENCOUNTER — Encounter (HOSPITAL_COMMUNITY): Payer: Self-pay | Admitting: Gastroenterology

## 2023-09-12 LAB — SURGICAL PATHOLOGY

## 2023-09-15 ENCOUNTER — Encounter (INDEPENDENT_AMBULATORY_CARE_PROVIDER_SITE_OTHER): Payer: Self-pay | Admitting: *Deleted

## 2023-12-22 ENCOUNTER — Other Ambulatory Visit (HOSPITAL_COMMUNITY): Payer: Self-pay | Admitting: Family Medicine

## 2023-12-22 DIAGNOSIS — M810 Age-related osteoporosis without current pathological fracture: Secondary | ICD-10-CM

## 2023-12-26 ENCOUNTER — Ambulatory Visit (HOSPITAL_COMMUNITY)
Admission: RE | Admit: 2023-12-26 | Discharge: 2023-12-26 | Disposition: A | Discharge status: Home / Self Care | Source: Ambulatory Visit | Attending: Family Medicine | Admitting: Family Medicine

## 2023-12-26 DIAGNOSIS — M810 Age-related osteoporosis without current pathological fracture: Secondary | ICD-10-CM | POA: Insufficient documentation

## 2024-03-10 ENCOUNTER — Encounter (INDEPENDENT_AMBULATORY_CARE_PROVIDER_SITE_OTHER): Payer: Self-pay | Admitting: Gastroenterology
